# Patient Record
Sex: Female | Born: 1960 | Race: White | Hispanic: No | Marital: Married
Health system: Southern US, Community
[De-identification: ages and names within clinical notes are randomized; demographics above are authoritative.]

## PROBLEM LIST (undated history)

## (undated) DIAGNOSIS — L4050 Arthropathic psoriasis, unspecified: Secondary | ICD-10-CM

## (undated) DIAGNOSIS — M7581 Other shoulder lesions, right shoulder: Secondary | ICD-10-CM

## (undated) DIAGNOSIS — E039 Hypothyroidism, unspecified: Secondary | ICD-10-CM

## (undated) DIAGNOSIS — G8929 Other chronic pain: Secondary | ICD-10-CM

## (undated) DIAGNOSIS — K219 Gastro-esophageal reflux disease without esophagitis: Secondary | ICD-10-CM

## (undated) DIAGNOSIS — J4521 Mild intermittent asthma with (acute) exacerbation: Secondary | ICD-10-CM

## (undated) DIAGNOSIS — I10 Essential (primary) hypertension: Secondary | ICD-10-CM

## (undated) DIAGNOSIS — M48062 Spinal stenosis, lumbar region with neurogenic claudication: Secondary | ICD-10-CM

## (undated) DIAGNOSIS — M75121 Complete rotator cuff tear or rupture of right shoulder, not specified as traumatic: Secondary | ICD-10-CM

## (undated) DIAGNOSIS — M810 Age-related osteoporosis without current pathological fracture: Secondary | ICD-10-CM

## (undated) HISTORY — PX: BREAST BIOPSY: SHX20

---

## 2005-06-23 ENCOUNTER — Ambulatory Visit: Payer: Self-pay | Admitting: Obstetrics and Gynecology

## 2006-12-24 ENCOUNTER — Ambulatory Visit: Payer: Self-pay | Admitting: Obstetrics and Gynecology

## 2008-09-22 ENCOUNTER — Ambulatory Visit (HOSPITAL_COMMUNITY): Admission: RE | Admit: 2008-09-22 | Discharge: 2008-09-22 | Payer: Self-pay | Admitting: Neurosurgery

## 2008-12-12 ENCOUNTER — Ambulatory Visit: Payer: Self-pay | Admitting: Pain Medicine

## 2009-01-16 ENCOUNTER — Ambulatory Visit: Payer: Self-pay | Admitting: Pain Medicine

## 2009-01-24 ENCOUNTER — Ambulatory Visit: Payer: Self-pay | Admitting: Pain Medicine

## 2009-03-01 ENCOUNTER — Ambulatory Visit: Payer: Self-pay | Admitting: Pain Medicine

## 2009-03-07 ENCOUNTER — Ambulatory Visit: Payer: Self-pay | Admitting: Pain Medicine

## 2009-04-03 ENCOUNTER — Ambulatory Visit: Payer: Self-pay | Admitting: Pain Medicine

## 2009-05-28 ENCOUNTER — Ambulatory Visit: Payer: Self-pay | Admitting: Pain Medicine

## 2009-07-10 ENCOUNTER — Ambulatory Visit: Payer: Self-pay | Admitting: Pain Medicine

## 2009-11-13 ENCOUNTER — Ambulatory Visit: Payer: Self-pay | Admitting: Pain Medicine

## 2009-12-13 ENCOUNTER — Ambulatory Visit: Payer: Self-pay | Admitting: Pain Medicine

## 2010-01-08 ENCOUNTER — Ambulatory Visit: Payer: Self-pay | Admitting: Pain Medicine

## 2010-02-07 ENCOUNTER — Ambulatory Visit: Payer: Self-pay | Admitting: Pain Medicine

## 2010-04-04 ENCOUNTER — Ambulatory Visit: Payer: Self-pay | Admitting: Pain Medicine

## 2010-04-17 ENCOUNTER — Ambulatory Visit: Payer: Self-pay | Admitting: Pain Medicine

## 2010-05-09 ENCOUNTER — Ambulatory Visit: Payer: Self-pay | Admitting: Pain Medicine

## 2010-06-04 ENCOUNTER — Encounter: Payer: Self-pay | Admitting: Family Medicine

## 2010-06-13 ENCOUNTER — Ambulatory Visit: Payer: Self-pay | Admitting: Obstetrics and Gynecology

## 2010-06-20 ENCOUNTER — Ambulatory Visit: Payer: Self-pay | Admitting: Pain Medicine

## 2011-07-16 ENCOUNTER — Ambulatory Visit: Payer: Self-pay | Admitting: Family Medicine

## 2013-11-22 ENCOUNTER — Ambulatory Visit: Payer: Self-pay | Admitting: Physician Assistant

## 2013-12-13 ENCOUNTER — Ambulatory Visit: Payer: Self-pay | Admitting: Orthopedic Surgery

## 2014-10-28 NOTE — Op Note (Signed)
PATIENT NAME:  [name], Joanna Harrison, Joanna Harrison  DATE OF PROCEDURE:  12/13/2013  PREOPERATIVE DIAGNOSIS: Right knee degenerative osteoarthritis, medial meniscus tear.   POSTOPERATIVE DIAGNOSIS: Right knee degenerative osteoarthritis, medial meniscus tear with lateral meniscus tear.   PROCEDURE: Arthroscopy, partial medial lateral meniscectomy.   ANESTHESIA: General.   SURGEON: Kennedy BuckerMichael Rachal Dvorsky, MD   DESCRIPTION OF PROCEDURE: The patient was brought to the operating room and after adequate anesthesia was obtained, the right leg was prepped and draped in the usual sterile fashion with a tourniquet applied but not required along with arthroscopic legholder. After patient identification, timeout procedures were completed and having prepped and draped the leg in the usual sterile fashion, an inferolateral portal was made and the arthroscope was introduced. Inspection revealed mild to moderate patellofemoral degenerative change with good patellofemoral tracking. Moving medially, an inferomedial portal was made. There was a tear of the posterior horn as well as in the middle 3rd complex radial tear. There were again mild degenerative changes to both tibia and femoral condyles with evidence of chondrocalcinosis. The ACL was intact in the notch. Going laterally, there was a tear at the junction of the posterior and middle thirds. There was a radial type tear. A meniscal punch was used to debride the tears. Following this, a shaver was utilized to remove the fragments as well as to smooth the edges and finally ArthroCare wand was used to ablate the edges of the meniscus to get a stable margin, both medially and laterally. The gutters were checked and there are no loose bodies. The shaver was inserted and thorough irrigation of the knee was performed. Arthroscope and all instrumentation were removed. The wounds were closed with simple interrupted 4-0 nylon, 20 mL of 0.5% Sensorcaine with  epinephrine was infiltrated into the area of the portals for postoperative analgesia. Xeroform, 4 x 4's, Webril, and Ace wrap applied. The patient was sent to the recovery room in stable condition.   ESTIMATED BLOOD LOSS: Minimal.   COMPLICATIONS: None.   SPECIMEN: None.   IMPLANTS:  None.  Preoperative and postoperative pictures were obtained through the knee scope.   ____________________________ Leitha SchullerMichael J. Moe Brier, MD mjm:dd D: 12/13/2013 20:10:02 ET T: 12/13/2013 21:45:40 ET JOB#: [id number]  cc: Leitha SchullerMichael J. Oreoluwa Aigner, MD, <Dictator> Leitha SchullerMICHAEL J Khrystyna Schwalm MD ELECTRONICALLY SIGNED 12/14/2013 8:10

## 2016-09-03 ENCOUNTER — Other Ambulatory Visit: Payer: Self-pay

## 2016-09-29 ENCOUNTER — Telehealth: Payer: Self-pay | Admitting: Obstetrics & Gynecology

## 2016-09-29 NOTE — Telephone Encounter (Signed)
[facility] requesting refill per Patient, Pt is an former [facility] and needs to be schedule for annual.

## 2016-10-01 NOTE — Telephone Encounter (Signed)
[facility] an annual exam[facility] Prescription Not authorized 09/25/16

## 2016-10-01 NOTE — Telephone Encounter (Signed)
[facility] F[facility] on March 22,2018.

## 2016-10-09 ENCOUNTER — Other Ambulatory Visit: Payer: Self-pay | Admitting: Obstetrics & Gynecology

## 2016-11-19 ENCOUNTER — Other Ambulatory Visit: Payer: Self-pay | Admitting: Family Medicine

## 2016-11-19 ENCOUNTER — Other Ambulatory Visit: Payer: Self-pay | Admitting: Obstetrics & Gynecology

## 2016-11-19 DIAGNOSIS — Z1231 Encounter for screening mammogram for malignant neoplasm of breast: Secondary | ICD-10-CM

## 2016-12-05 ENCOUNTER — Encounter: Payer: Self-pay | Admitting: Radiology

## 2016-12-05 ENCOUNTER — Ambulatory Visit
Admission: RE | Admit: 2016-12-05 | Discharge: 2016-12-05 | Disposition: A | Discharge status: Home / Self Care | Payer: 59 | Source: Ambulatory Visit | Attending: Obstetrics & Gynecology | Admitting: Obstetrics & Gynecology

## 2016-12-05 DIAGNOSIS — Z1231 Encounter for screening mammogram for malignant neoplasm of breast: Secondary | ICD-10-CM | POA: Insufficient documentation

## 2016-12-11 ENCOUNTER — Other Ambulatory Visit: Payer: Self-pay | Admitting: *Deleted

## 2016-12-11 ENCOUNTER — Inpatient Hospital Stay
Admission: RE | Admit: 2016-12-11 | Discharge: 2016-12-11 | Disposition: A | Discharge status: Home / Self Care | Payer: Self-pay | Source: Ambulatory Visit | Attending: *Deleted | Admitting: *Deleted

## 2016-12-11 DIAGNOSIS — Z9289 Personal history of other medical treatment: Secondary | ICD-10-CM

## 2018-03-01 ENCOUNTER — Other Ambulatory Visit: Payer: Self-pay | Admitting: Physician Assistant

## 2018-03-01 DIAGNOSIS — M5030 Other cervical disc degeneration, unspecified cervical region: Secondary | ICD-10-CM

## 2018-03-01 DIAGNOSIS — M25511 Pain in right shoulder: Secondary | ICD-10-CM

## 2018-03-01 DIAGNOSIS — M25512 Pain in left shoulder: Secondary | ICD-10-CM

## 2018-03-14 ENCOUNTER — Ambulatory Visit
Admission: RE | Admit: 2018-03-14 | Discharge: 2018-03-14 | Disposition: A | Discharge status: Home / Self Care | Payer: 59 | Source: Ambulatory Visit | Attending: Physician Assistant | Admitting: Physician Assistant

## 2018-03-14 DIAGNOSIS — G950 Syringomyelia and syringobulbia: Secondary | ICD-10-CM | POA: Insufficient documentation

## 2018-03-14 DIAGNOSIS — M5030 Other cervical disc degeneration, unspecified cervical region: Secondary | ICD-10-CM

## 2018-03-14 DIAGNOSIS — M25512 Pain in left shoulder: Secondary | ICD-10-CM | POA: Insufficient documentation

## 2018-03-14 DIAGNOSIS — M50321 Other cervical disc degeneration at C4-C5 level: Secondary | ICD-10-CM | POA: Insufficient documentation

## 2018-03-14 DIAGNOSIS — M47812 Spondylosis without myelopathy or radiculopathy, cervical region: Secondary | ICD-10-CM | POA: Insufficient documentation

## 2018-03-14 DIAGNOSIS — M25511 Pain in right shoulder: Secondary | ICD-10-CM | POA: Insufficient documentation

## 2018-03-14 DIAGNOSIS — M4802 Spinal stenosis, cervical region: Secondary | ICD-10-CM | POA: Insufficient documentation

## 2018-03-23 ENCOUNTER — Other Ambulatory Visit: Payer: Self-pay | Admitting: Neurological Surgery

## 2018-03-23 DIAGNOSIS — G950 Syringomyelia and syringobulbia: Secondary | ICD-10-CM

## 2018-04-09 ENCOUNTER — Other Ambulatory Visit: Payer: 59

## 2018-04-09 ENCOUNTER — Ambulatory Visit: Payer: 59

## 2018-06-16 ENCOUNTER — Other Ambulatory Visit: Payer: Self-pay | Admitting: Obstetrics & Gynecology

## 2018-06-16 DIAGNOSIS — Z1231 Encounter for screening mammogram for malignant neoplasm of breast: Secondary | ICD-10-CM

## 2018-06-17 ENCOUNTER — Ambulatory Visit
Admission: RE | Admit: 2018-06-17 | Discharge: 2018-06-17 | Disposition: A | Discharge status: Home / Self Care | Payer: 59 | Source: Ambulatory Visit | Attending: Obstetrics & Gynecology | Admitting: Obstetrics & Gynecology

## 2018-06-17 DIAGNOSIS — Z1231 Encounter for screening mammogram for malignant neoplasm of breast: Secondary | ICD-10-CM | POA: Insufficient documentation

## 2019-05-11 ENCOUNTER — Other Ambulatory Visit: Payer: Self-pay | Admitting: Family Medicine

## 2019-05-11 DIAGNOSIS — Z1231 Encounter for screening mammogram for malignant neoplasm of breast: Secondary | ICD-10-CM

## 2019-06-21 ENCOUNTER — Ambulatory Visit
Admission: RE | Admit: 2019-06-21 | Discharge: 2019-06-21 | Disposition: A | Discharge status: Home / Self Care | Payer: 59 | Source: Ambulatory Visit | Attending: Family Medicine | Admitting: Family Medicine

## 2019-06-21 DIAGNOSIS — Z1231 Encounter for screening mammogram for malignant neoplasm of breast: Secondary | ICD-10-CM | POA: Diagnosis not present

## 2019-11-29 IMAGING — MG DIGITAL SCREENING BILATERAL MAMMOGRAM WITH TOMO AND CAD
6 of 10 series · 6 of 30 positions shown · non-contrast
Comparison: Previous exam(s).

CLINICAL DATA: Screening.

EXAM:
DIGITAL SCREENING BILATERAL MAMMOGRAM WITH TOMO AND CAD

[R MLO synth-2D]
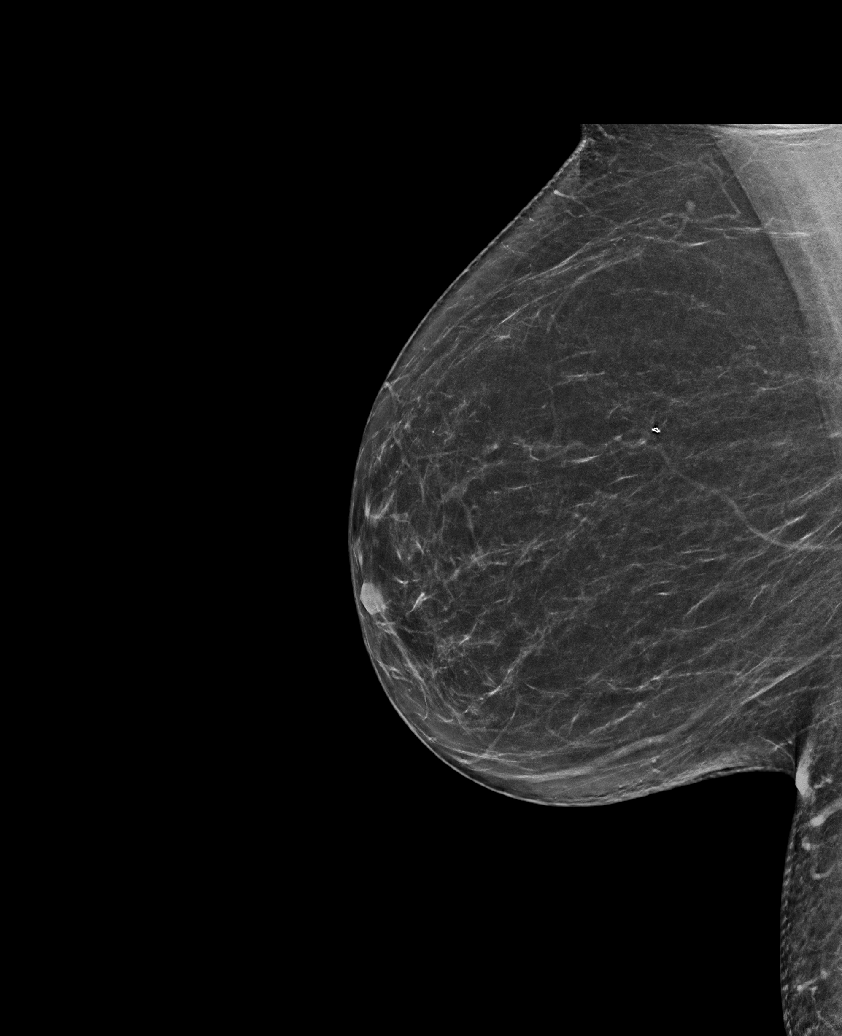

[L CC synth-2D]
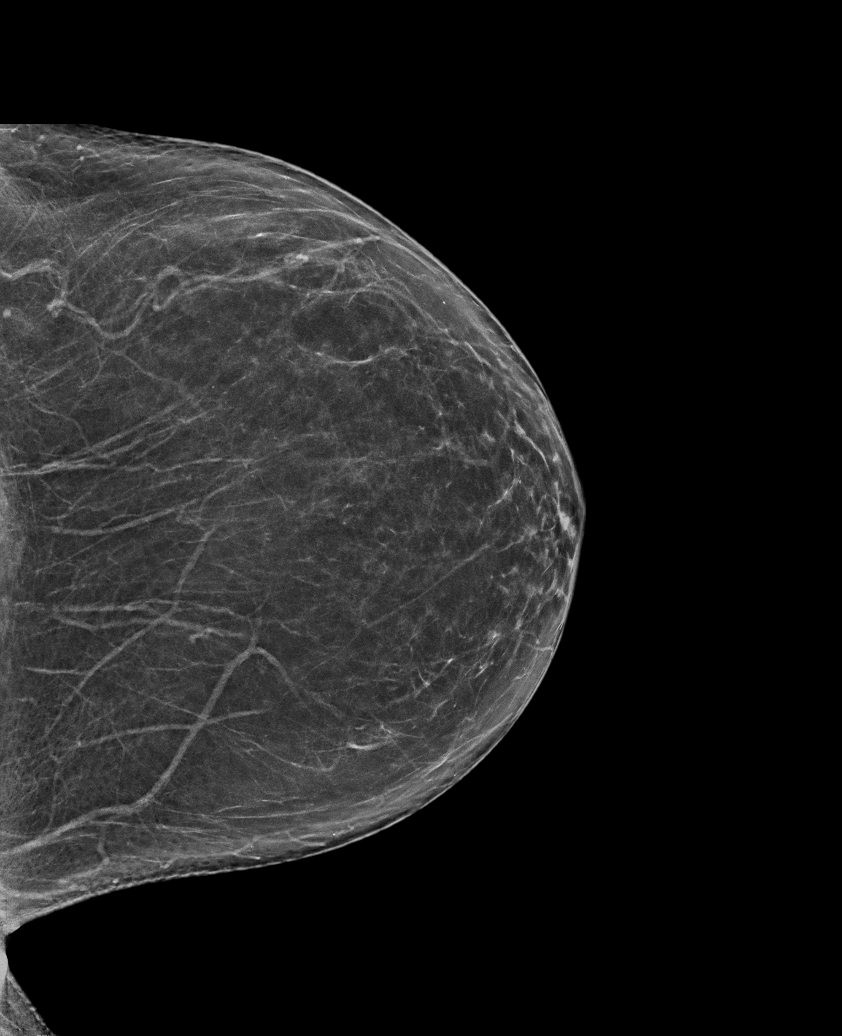

[R XCCL synth-2D]
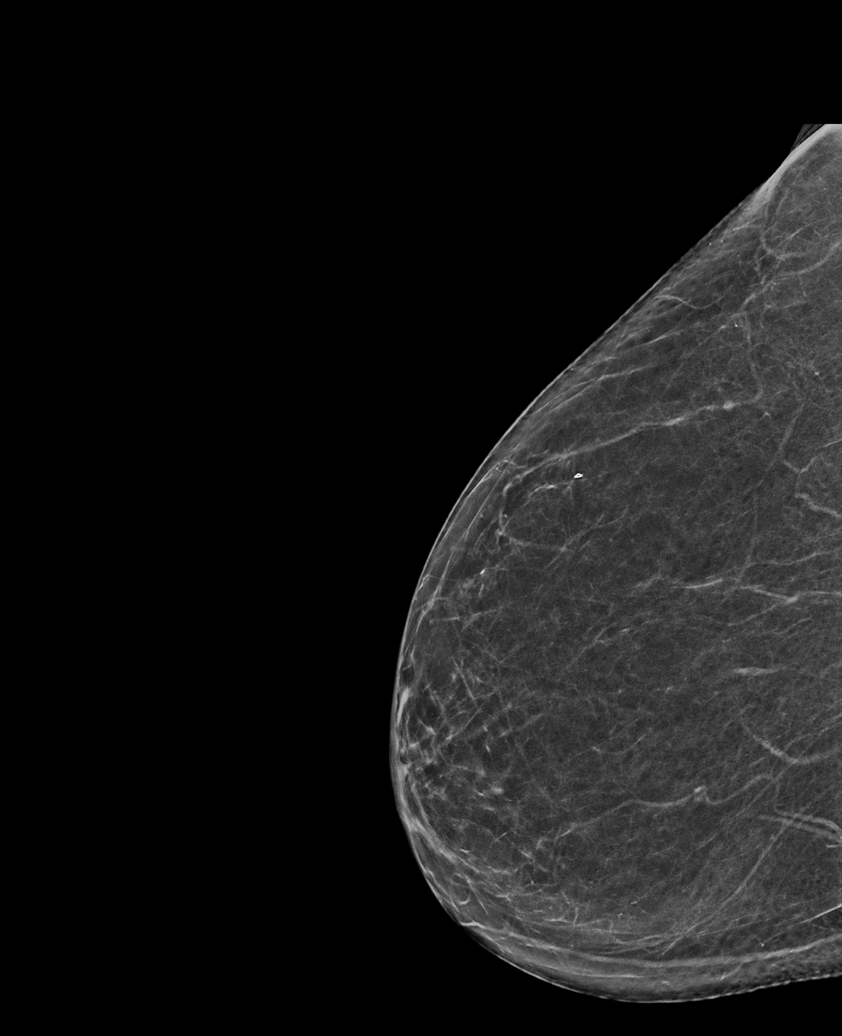

[L MLO synth-2D]
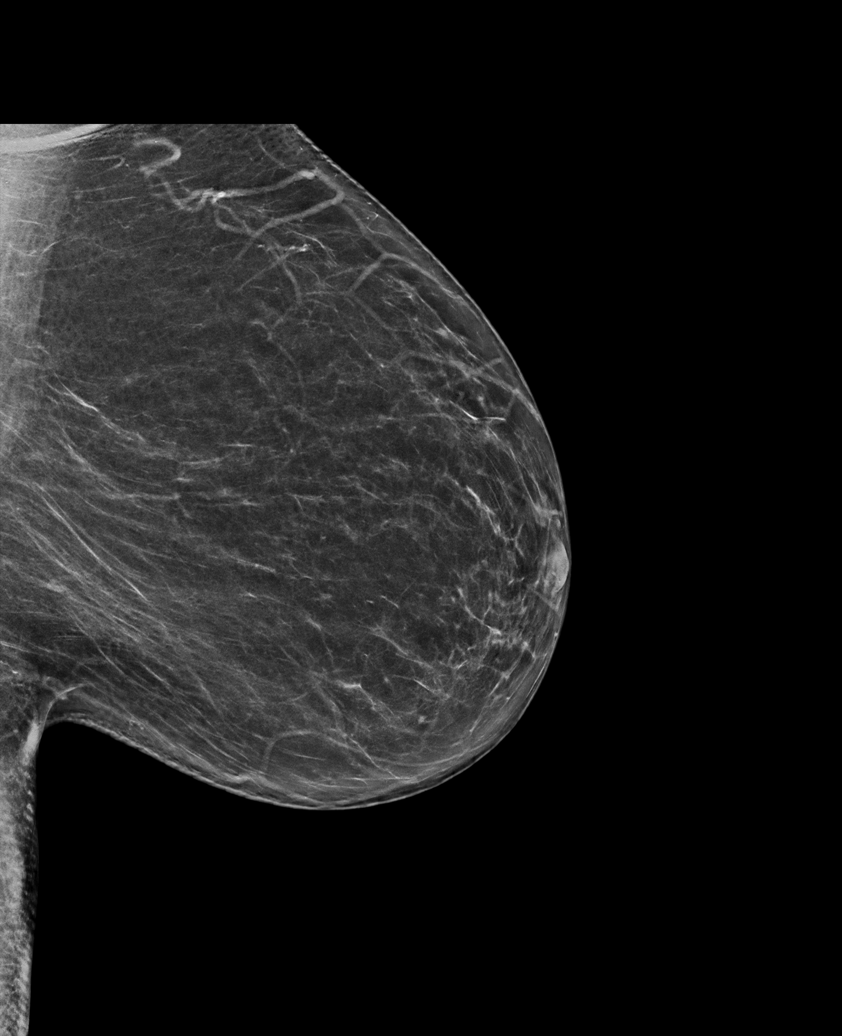

[R CC synth-2D]
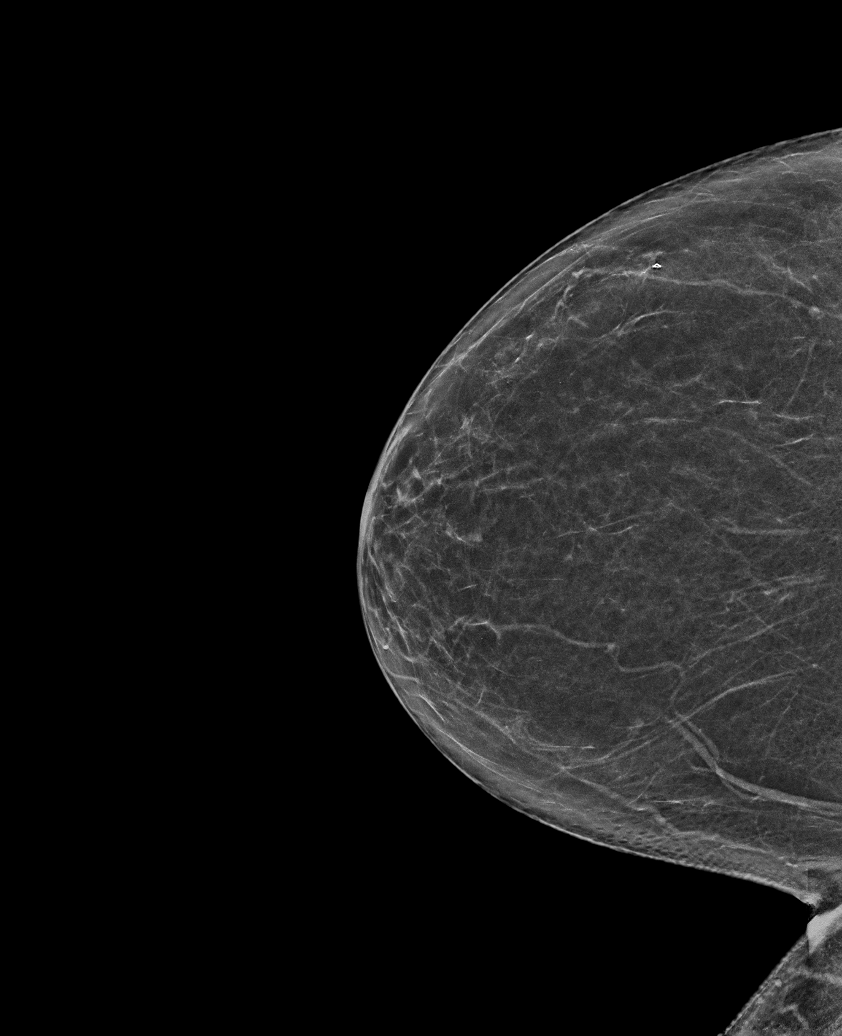

[L CC tomo · tomo slice 35/70.0]
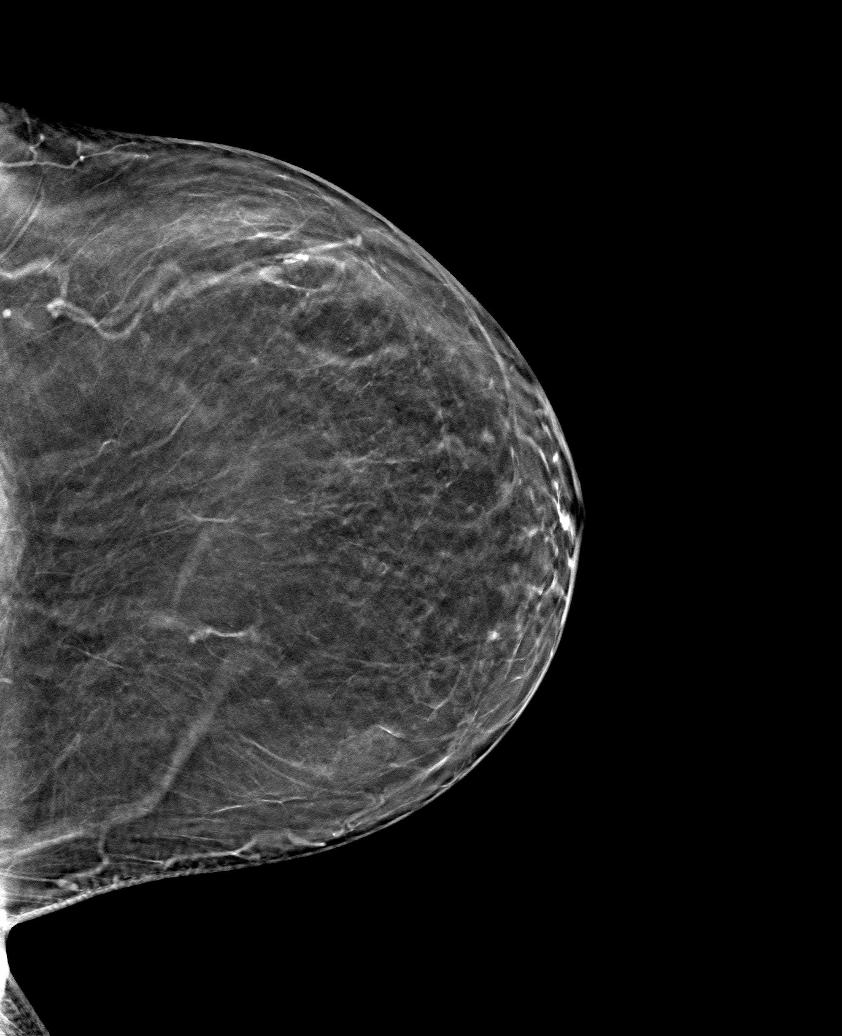

[6 of 30 positions shown; findings below may reference images not displayed]

ACR Breast Density Category b: There are scattered areas of
fibroglandular density.
FINDINGS: There are no findings suspicious for malignancy. Images were
processed with CAD.
IMPRESSION: No mammographic evidence of malignancy. A result letter of this
screening mammogram will be mailed directly to the patient.

RECOMMENDATION:
Screening mammogram in one year. (Code:CN-U-775)

BI-RADS CATEGORY  1: Negative.

## 2021-06-21 ENCOUNTER — Other Ambulatory Visit: Payer: Self-pay | Admitting: Obstetrics and Gynecology

## 2021-06-21 DIAGNOSIS — Z1231 Encounter for screening mammogram for malignant neoplasm of breast: Secondary | ICD-10-CM

## 2021-07-12 DIAGNOSIS — Z79899 Other long term (current) drug therapy: Secondary | ICD-10-CM | POA: Diagnosis not present

## 2021-07-12 DIAGNOSIS — L4050 Arthropathic psoriasis, unspecified: Secondary | ICD-10-CM | POA: Diagnosis not present

## 2021-10-03 DIAGNOSIS — K219 Gastro-esophageal reflux disease without esophagitis: Secondary | ICD-10-CM | POA: Diagnosis not present

## 2021-10-03 DIAGNOSIS — E782 Mixed hyperlipidemia: Secondary | ICD-10-CM | POA: Diagnosis not present

## 2021-10-10 DIAGNOSIS — L4050 Arthropathic psoriasis, unspecified: Secondary | ICD-10-CM | POA: Diagnosis not present

## 2021-10-10 DIAGNOSIS — Z79899 Other long term (current) drug therapy: Secondary | ICD-10-CM | POA: Diagnosis not present

## 2021-10-17 DIAGNOSIS — Z79899 Other long term (current) drug therapy: Secondary | ICD-10-CM | POA: Diagnosis not present

## 2021-10-17 DIAGNOSIS — L4050 Arthropathic psoriasis, unspecified: Secondary | ICD-10-CM | POA: Diagnosis not present

## 2021-11-27 DIAGNOSIS — J4521 Mild intermittent asthma with (acute) exacerbation: Secondary | ICD-10-CM | POA: Diagnosis not present

## 2021-11-27 DIAGNOSIS — J302 Other seasonal allergic rhinitis: Secondary | ICD-10-CM | POA: Diagnosis not present

## 2021-11-27 DIAGNOSIS — R69 Illness, unspecified: Secondary | ICD-10-CM | POA: Diagnosis not present

## 2021-11-27 DIAGNOSIS — L409 Psoriasis, unspecified: Secondary | ICD-10-CM | POA: Diagnosis not present

## 2021-11-27 DIAGNOSIS — E079 Disorder of thyroid, unspecified: Secondary | ICD-10-CM | POA: Diagnosis not present

## 2022-02-17 DIAGNOSIS — Z79899 Other long term (current) drug therapy: Secondary | ICD-10-CM | POA: Diagnosis not present

## 2022-02-17 DIAGNOSIS — L4050 Arthropathic psoriasis, unspecified: Secondary | ICD-10-CM | POA: Diagnosis not present

## 2022-03-31 DIAGNOSIS — L4050 Arthropathic psoriasis, unspecified: Secondary | ICD-10-CM | POA: Diagnosis not present

## 2022-05-07 DIAGNOSIS — L4050 Arthropathic psoriasis, unspecified: Secondary | ICD-10-CM | POA: Diagnosis not present

## 2022-05-07 DIAGNOSIS — Z79899 Other long term (current) drug therapy: Secondary | ICD-10-CM | POA: Diagnosis not present

## 2022-05-09 DIAGNOSIS — J019 Acute sinusitis, unspecified: Secondary | ICD-10-CM | POA: Diagnosis not present

## 2022-05-09 DIAGNOSIS — B9689 Other specified bacterial agents as the cause of diseases classified elsewhere: Secondary | ICD-10-CM | POA: Diagnosis not present

## 2022-06-02 DIAGNOSIS — R053 Chronic cough: Secondary | ICD-10-CM | POA: Diagnosis not present

## 2022-06-02 DIAGNOSIS — L4050 Arthropathic psoriasis, unspecified: Secondary | ICD-10-CM | POA: Diagnosis not present

## 2022-06-02 DIAGNOSIS — J4520 Mild intermittent asthma, uncomplicated: Secondary | ICD-10-CM | POA: Diagnosis not present

## 2022-06-10 ENCOUNTER — Other Ambulatory Visit: Payer: Self-pay | Admitting: Obstetrics and Gynecology

## 2022-06-10 DIAGNOSIS — Z1231 Encounter for screening mammogram for malignant neoplasm of breast: Secondary | ICD-10-CM

## 2022-06-12 DIAGNOSIS — N390 Urinary tract infection, site not specified: Secondary | ICD-10-CM | POA: Diagnosis not present

## 2022-06-25 DIAGNOSIS — E079 Disorder of thyroid, unspecified: Secondary | ICD-10-CM | POA: Diagnosis not present

## 2022-06-25 DIAGNOSIS — K219 Gastro-esophageal reflux disease without esophagitis: Secondary | ICD-10-CM | POA: Diagnosis not present

## 2022-06-25 DIAGNOSIS — Z0000 Encounter for general adult medical examination without abnormal findings: Secondary | ICD-10-CM | POA: Diagnosis not present

## 2022-06-25 DIAGNOSIS — R69 Illness, unspecified: Secondary | ICD-10-CM | POA: Diagnosis not present

## 2022-06-25 DIAGNOSIS — E782 Mixed hyperlipidemia: Secondary | ICD-10-CM | POA: Diagnosis not present

## 2022-06-25 DIAGNOSIS — L4050 Arthropathic psoriasis, unspecified: Secondary | ICD-10-CM | POA: Diagnosis not present

## 2022-06-25 DIAGNOSIS — J4521 Mild intermittent asthma with (acute) exacerbation: Secondary | ICD-10-CM | POA: Diagnosis not present

## 2022-07-16 ENCOUNTER — Ambulatory Visit
Admission: RE | Admit: 2022-07-16 | Discharge: 2022-07-16 | Disposition: A | Discharge status: Home / Self Care | Payer: 59 | Source: Ambulatory Visit | Attending: Obstetrics and Gynecology | Admitting: Obstetrics and Gynecology

## 2022-07-16 DIAGNOSIS — Z1231 Encounter for screening mammogram for malignant neoplasm of breast: Secondary | ICD-10-CM | POA: Diagnosis not present

## 2022-08-08 DIAGNOSIS — M545 Low back pain, unspecified: Secondary | ICD-10-CM | POA: Diagnosis not present

## 2022-08-08 DIAGNOSIS — G8929 Other chronic pain: Secondary | ICD-10-CM | POA: Diagnosis not present

## 2022-08-08 DIAGNOSIS — M79652 Pain in left thigh: Secondary | ICD-10-CM | POA: Diagnosis not present

## 2022-08-13 DIAGNOSIS — E782 Mixed hyperlipidemia: Secondary | ICD-10-CM | POA: Diagnosis not present

## 2022-08-13 DIAGNOSIS — L4050 Arthropathic psoriasis, unspecified: Secondary | ICD-10-CM | POA: Diagnosis not present

## 2022-08-13 DIAGNOSIS — K219 Gastro-esophageal reflux disease without esophagitis: Secondary | ICD-10-CM | POA: Diagnosis not present

## 2022-08-13 DIAGNOSIS — Z79899 Other long term (current) drug therapy: Secondary | ICD-10-CM | POA: Diagnosis not present

## 2022-08-13 DIAGNOSIS — R69 Illness, unspecified: Secondary | ICD-10-CM | POA: Diagnosis not present

## 2022-08-13 DIAGNOSIS — M5416 Radiculopathy, lumbar region: Secondary | ICD-10-CM | POA: Diagnosis not present

## 2022-08-13 DIAGNOSIS — F32A Depression, unspecified: Secondary | ICD-10-CM | POA: Diagnosis not present

## 2022-08-13 DIAGNOSIS — M5136 Other intervertebral disc degeneration, lumbar region: Secondary | ICD-10-CM | POA: Diagnosis not present

## 2022-08-27 ENCOUNTER — Other Ambulatory Visit: Payer: Self-pay | Admitting: Family Medicine

## 2022-08-27 DIAGNOSIS — M5416 Radiculopathy, lumbar region: Secondary | ICD-10-CM

## 2022-09-09 ENCOUNTER — Encounter: Payer: Self-pay | Admitting: Family Medicine

## 2022-09-10 ENCOUNTER — Ambulatory Visit
Admission: RE | Admit: 2022-09-10 | Discharge: 2022-09-10 | Disposition: A | Discharge status: Home / Self Care | Payer: 59 | Source: Ambulatory Visit | Attending: Family Medicine | Admitting: Family Medicine

## 2022-09-10 DIAGNOSIS — M4316 Spondylolisthesis, lumbar region: Secondary | ICD-10-CM | POA: Diagnosis not present

## 2022-09-10 DIAGNOSIS — M48061 Spinal stenosis, lumbar region without neurogenic claudication: Secondary | ICD-10-CM | POA: Diagnosis not present

## 2022-09-10 DIAGNOSIS — M5416 Radiculopathy, lumbar region: Secondary | ICD-10-CM

## 2022-09-10 DIAGNOSIS — M5127 Other intervertebral disc displacement, lumbosacral region: Secondary | ICD-10-CM | POA: Diagnosis not present

## 2022-09-15 DIAGNOSIS — M5416 Radiculopathy, lumbar region: Secondary | ICD-10-CM | POA: Diagnosis not present

## 2022-09-15 DIAGNOSIS — M5136 Other intervertebral disc degeneration, lumbar region: Secondary | ICD-10-CM | POA: Diagnosis not present

## 2022-09-15 DIAGNOSIS — M5126 Other intervertebral disc displacement, lumbar region: Secondary | ICD-10-CM | POA: Diagnosis not present

## 2022-09-17 DIAGNOSIS — M5126 Other intervertebral disc displacement, lumbar region: Secondary | ICD-10-CM | POA: Diagnosis not present

## 2022-09-17 DIAGNOSIS — M5416 Radiculopathy, lumbar region: Secondary | ICD-10-CM | POA: Diagnosis not present

## 2022-10-16 DIAGNOSIS — M5136 Other intervertebral disc degeneration, lumbar region: Secondary | ICD-10-CM | POA: Diagnosis not present

## 2022-10-16 DIAGNOSIS — M5416 Radiculopathy, lumbar region: Secondary | ICD-10-CM | POA: Diagnosis not present

## 2022-10-16 DIAGNOSIS — M47816 Spondylosis without myelopathy or radiculopathy, lumbar region: Secondary | ICD-10-CM | POA: Diagnosis not present

## 2022-10-16 DIAGNOSIS — M5126 Other intervertebral disc displacement, lumbar region: Secondary | ICD-10-CM | POA: Diagnosis not present

## 2022-10-24 DIAGNOSIS — M47816 Spondylosis without myelopathy or radiculopathy, lumbar region: Secondary | ICD-10-CM | POA: Diagnosis not present

## 2022-11-11 DIAGNOSIS — M47816 Spondylosis without myelopathy or radiculopathy, lumbar region: Secondary | ICD-10-CM | POA: Diagnosis not present

## 2022-11-12 DIAGNOSIS — M25561 Pain in right knee: Secondary | ICD-10-CM | POA: Diagnosis not present

## 2022-11-12 DIAGNOSIS — M1711 Unilateral primary osteoarthritis, right knee: Secondary | ICD-10-CM | POA: Diagnosis not present

## 2022-11-24 DIAGNOSIS — Z79899 Other long term (current) drug therapy: Secondary | ICD-10-CM | POA: Diagnosis not present

## 2022-11-24 DIAGNOSIS — L4050 Arthropathic psoriasis, unspecified: Secondary | ICD-10-CM | POA: Diagnosis not present

## 2022-11-25 DIAGNOSIS — M47816 Spondylosis without myelopathy or radiculopathy, lumbar region: Secondary | ICD-10-CM | POA: Diagnosis not present

## 2022-12-19 DIAGNOSIS — L4050 Arthropathic psoriasis, unspecified: Secondary | ICD-10-CM | POA: Diagnosis not present

## 2022-12-19 DIAGNOSIS — G8929 Other chronic pain: Secondary | ICD-10-CM | POA: Diagnosis not present

## 2022-12-19 DIAGNOSIS — M545 Low back pain, unspecified: Secondary | ICD-10-CM | POA: Diagnosis not present

## 2022-12-19 DIAGNOSIS — F32A Depression, unspecified: Secondary | ICD-10-CM | POA: Diagnosis not present

## 2022-12-19 DIAGNOSIS — E079 Disorder of thyroid, unspecified: Secondary | ICD-10-CM | POA: Diagnosis not present

## 2022-12-19 DIAGNOSIS — K219 Gastro-esophageal reflux disease without esophagitis: Secondary | ICD-10-CM | POA: Diagnosis not present

## 2023-01-07 DIAGNOSIS — M47816 Spondylosis without myelopathy or radiculopathy, lumbar region: Secondary | ICD-10-CM | POA: Diagnosis not present

## 2023-01-07 DIAGNOSIS — M5136 Other intervertebral disc degeneration, lumbar region: Secondary | ICD-10-CM | POA: Diagnosis not present

## 2023-01-07 DIAGNOSIS — M5416 Radiculopathy, lumbar region: Secondary | ICD-10-CM | POA: Diagnosis not present

## 2023-01-26 DIAGNOSIS — Z79899 Other long term (current) drug therapy: Secondary | ICD-10-CM | POA: Diagnosis not present

## 2023-01-26 DIAGNOSIS — L4050 Arthropathic psoriasis, unspecified: Secondary | ICD-10-CM | POA: Diagnosis not present

## 2023-04-29 DIAGNOSIS — L4050 Arthropathic psoriasis, unspecified: Secondary | ICD-10-CM | POA: Diagnosis not present

## 2023-06-29 DIAGNOSIS — E079 Disorder of thyroid, unspecified: Secondary | ICD-10-CM | POA: Diagnosis not present

## 2023-06-29 DIAGNOSIS — Z0000 Encounter for general adult medical examination without abnormal findings: Secondary | ICD-10-CM | POA: Diagnosis not present

## 2023-06-29 DIAGNOSIS — K219 Gastro-esophageal reflux disease without esophagitis: Secondary | ICD-10-CM | POA: Diagnosis not present

## 2023-06-29 DIAGNOSIS — E782 Mixed hyperlipidemia: Secondary | ICD-10-CM | POA: Diagnosis not present

## 2023-06-29 DIAGNOSIS — F32A Depression, unspecified: Secondary | ICD-10-CM | POA: Diagnosis not present

## 2023-06-29 DIAGNOSIS — L4050 Arthropathic psoriasis, unspecified: Secondary | ICD-10-CM | POA: Diagnosis not present

## 2023-07-08 DIAGNOSIS — R399 Unspecified symptoms and signs involving the genitourinary system: Secondary | ICD-10-CM | POA: Diagnosis not present

## 2023-07-20 DIAGNOSIS — R300 Dysuria: Secondary | ICD-10-CM | POA: Diagnosis not present

## 2023-07-20 DIAGNOSIS — Z87440 Personal history of urinary (tract) infections: Secondary | ICD-10-CM | POA: Diagnosis not present

## 2023-08-14 DIAGNOSIS — E782 Mixed hyperlipidemia: Secondary | ICD-10-CM | POA: Diagnosis not present

## 2023-08-14 DIAGNOSIS — Z79899 Other long term (current) drug therapy: Secondary | ICD-10-CM | POA: Diagnosis not present

## 2023-08-14 DIAGNOSIS — R52 Pain, unspecified: Secondary | ICD-10-CM | POA: Diagnosis not present

## 2023-08-14 DIAGNOSIS — L4050 Arthropathic psoriasis, unspecified: Secondary | ICD-10-CM | POA: Diagnosis not present

## 2023-08-25 DIAGNOSIS — M5416 Radiculopathy, lumbar region: Secondary | ICD-10-CM | POA: Diagnosis not present

## 2023-08-25 DIAGNOSIS — M4316 Spondylolisthesis, lumbar region: Secondary | ICD-10-CM | POA: Diagnosis not present

## 2023-08-31 ENCOUNTER — Other Ambulatory Visit: Payer: Self-pay | Admitting: Family Medicine

## 2023-08-31 DIAGNOSIS — Z1231 Encounter for screening mammogram for malignant neoplasm of breast: Secondary | ICD-10-CM

## 2023-09-11 DIAGNOSIS — M8588 Other specified disorders of bone density and structure, other site: Secondary | ICD-10-CM | POA: Diagnosis not present

## 2023-09-18 ENCOUNTER — Ambulatory Visit
Admission: RE | Admit: 2023-09-18 | Discharge: 2023-09-18 | Disposition: A | Discharge status: Home / Self Care | Payer: 59 | Source: Ambulatory Visit | Attending: Family Medicine | Admitting: Family Medicine

## 2023-09-18 DIAGNOSIS — Z1231 Encounter for screening mammogram for malignant neoplasm of breast: Secondary | ICD-10-CM | POA: Insufficient documentation

## 2023-09-25 DIAGNOSIS — Z01419 Encounter for gynecological examination (general) (routine) without abnormal findings: Secondary | ICD-10-CM | POA: Diagnosis not present

## 2023-10-09 DIAGNOSIS — E782 Mixed hyperlipidemia: Secondary | ICD-10-CM | POA: Diagnosis not present

## 2023-10-09 DIAGNOSIS — E079 Disorder of thyroid, unspecified: Secondary | ICD-10-CM | POA: Diagnosis not present

## 2023-10-22 DIAGNOSIS — H43813 Vitreous degeneration, bilateral: Secondary | ICD-10-CM | POA: Diagnosis not present

## 2023-10-22 DIAGNOSIS — H2513 Age-related nuclear cataract, bilateral: Secondary | ICD-10-CM | POA: Diagnosis not present

## 2023-10-26 DIAGNOSIS — Z1211 Encounter for screening for malignant neoplasm of colon: Secondary | ICD-10-CM | POA: Diagnosis not present

## 2023-10-31 DIAGNOSIS — M4726 Other spondylosis with radiculopathy, lumbar region: Secondary | ICD-10-CM | POA: Diagnosis not present

## 2023-10-31 DIAGNOSIS — M4807 Spinal stenosis, lumbosacral region: Secondary | ICD-10-CM | POA: Diagnosis not present

## 2023-10-31 DIAGNOSIS — M4316 Spondylolisthesis, lumbar region: Secondary | ICD-10-CM | POA: Diagnosis not present

## 2023-10-31 DIAGNOSIS — M48061 Spinal stenosis, lumbar region without neurogenic claudication: Secondary | ICD-10-CM | POA: Diagnosis not present

## 2023-10-31 DIAGNOSIS — M5116 Intervertebral disc disorders with radiculopathy, lumbar region: Secondary | ICD-10-CM | POA: Diagnosis not present

## 2023-10-31 DIAGNOSIS — M419 Scoliosis, unspecified: Secondary | ICD-10-CM | POA: Diagnosis not present

## 2023-10-31 DIAGNOSIS — M5416 Radiculopathy, lumbar region: Secondary | ICD-10-CM | POA: Diagnosis not present

## 2023-11-03 ENCOUNTER — Encounter: Payer: Self-pay | Admitting: Internal Medicine

## 2023-11-04 ENCOUNTER — Ambulatory Visit: Admitting: Certified Registered"

## 2023-11-04 ENCOUNTER — Ambulatory Visit
Admission: RE | Admit: 2023-11-04 | Discharge: 2023-11-04 | Disposition: A | Discharge status: Home / Self Care | Attending: Internal Medicine | Admitting: Internal Medicine

## 2023-11-04 ENCOUNTER — Encounter
Admission: RE | Disposition: A | Discharge status: Home / Self Care | Payer: Self-pay | Source: Home / Self Care | Attending: Internal Medicine

## 2023-11-04 DIAGNOSIS — K640 First degree hemorrhoids: Secondary | ICD-10-CM | POA: Diagnosis not present

## 2023-11-04 DIAGNOSIS — K5730 Diverticulosis of large intestine without perforation or abscess without bleeding: Secondary | ICD-10-CM | POA: Diagnosis not present

## 2023-11-04 DIAGNOSIS — Z79890 Hormone replacement therapy (postmenopausal): Secondary | ICD-10-CM | POA: Insufficient documentation

## 2023-11-04 DIAGNOSIS — K648 Other hemorrhoids: Secondary | ICD-10-CM | POA: Diagnosis not present

## 2023-11-04 DIAGNOSIS — Z1211 Encounter for screening for malignant neoplasm of colon: Secondary | ICD-10-CM | POA: Diagnosis not present

## 2023-11-04 DIAGNOSIS — E039 Hypothyroidism, unspecified: Secondary | ICD-10-CM | POA: Diagnosis not present

## 2023-11-04 SURGERY — COLONOSCOPY
Anesthesia: General

## 2023-11-04 MED ORDER — SODIUM CHLORIDE 0.9 % IV SOLN
INTRAVENOUS | Status: DC
  Administered 2023-11-04: 20 mL/h via INTRAVENOUS

## 2023-11-04 MED ORDER — PROPOFOL 500 MG/50ML IV EMUL
INTRAVENOUS | Status: DC | PRN
Start: 2023-11-04 — End: 2023-11-04
  Administered 2023-11-04: 150 ug/kg/min via INTRAVENOUS
  Administered 2023-11-04: 50 mg via INTRAVENOUS

## 2023-11-04 MED ORDER — LIDOCAINE HCL (CARDIAC) PF 100 MG/5ML IV SOSY
PREFILLED_SYRINGE | INTRAVENOUS | Status: DC | PRN
  Administered 2023-11-04: 100 mg via INTRAVENOUS

## 2023-11-04 NOTE — Op Note (Signed)
 [facility] Gastroenterology Patient Name: Joanna Harrison Procedure Date: 11/04/2023 10:29 AM MRN: [id number] Account #: [id number] Date of Birth: [birth year] Admit Type: Outpatient Age: 63 Room: [facility] ENDO ROOM 1 Gender: Female Note Status: Finalized Instrument Name: Charlyn Cooley [id number] Procedure:             Colonoscopy Indications:           Screening for colorectal malignant neoplasm Providers:             Alyannah Sanks K. Sammuel Blick MD, MD Medicines:             Propofol per Anesthesia Complications:         No immediate complications. Procedure:             Pre-Anesthesia Assessment:                        - The risks and benefits of the procedure and the                         sedation options and risks were discussed with the                         patient. All questions were answered and informed                         consent was obtained.                        - Patient identification and proposed procedure were                         verified prior to the procedure by the nurse. The                         procedure was verified in the procedure room.                        - ASA Grade Assessment: III - A patient with severe                         systemic disease.                        - After reviewing the risks and benefits, the patient                         was deemed in satisfactory condition to undergo the                         procedure.                        After obtaining informed consent, the colonoscope was                         passed under direct vision. Throughout the procedure,                         the patient's blood pressure, pulse, and oxygen  saturations were monitored continuously. The                         Colonoscope was introduced through the anus and                         advanced to the the cecum, identified by appendiceal                         orifice and ileocecal valve. The colonoscopy was                          performed without difficulty. The patient tolerated                         the procedure well. The quality of the bowel                         preparation was good. The ileocecal valve, appendiceal                         orifice, and rectum were photographed. Findings:      The perianal and digital rectal examinations were normal. Pertinent       negatives include normal sphincter tone and no palpable rectal lesions.      Non-bleeding internal hemorrhoids were found during retroflexion. The       hemorrhoids were Grade I (internal hemorrhoids that do not prolapse).      A few medium-mouthed diverticula were found in the ascending colon.      The exam was otherwise without abnormality. Impression:            - Non-bleeding internal hemorrhoids.                        - Diverticulosis in the ascending colon.                        - The examination was otherwise normal.                        - No specimens collected. Recommendation:        - Patient has a contact number available for                         emergencies. The signs and symptoms of potential                         delayed complications were discussed with the patient.                         Return to normal activities tomorrow. Written                         discharge instructions were provided to the patient.                        - Resume previous diet.                        - Continue  present medications.                        - Repeat colonoscopy in 10 years for screening                         purposes.                        - Return to GI office PRN.                        - The findings and recommendations were discussed with                         the patient. Procedure Code(s):     --- Professional ---                        Z6109, Colorectal cancer screening; colonoscopy on                         individual not meeting criteria for high risk Diagnosis Code(s):     --- Professional  ---                        K57.30, Diverticulosis of large intestine without                         perforation or abscess without bleeding                        K64.0, First degree hemorrhoids                        Z12.11, Encounter for screening for malignant neoplasm                         of colon CPT copyright 2022 [employer]. All rights reserved. The codes documented in this report are preliminary and upon coder review may  be revised to meet current compliance requirements. Cassie Click MD, MD 11/04/2023 11:00:46 AM This report has been signed electronically. Number of Addenda: 0 Note Initiated On: 11/04/2023 10:29 AM Scope Withdrawal Time: 0 hours 6 minutes 1 second  Total Procedure Duration: 0 hours 11 minutes 56 seconds  Estimated Blood Loss:  Estimated blood loss: none.      [facility]

## 2023-11-04 NOTE — Interval H&P Note (Signed)
 History and Physical Interval Note:  11/04/2023 10:11 AM  Joanna Harrison  has presented today for surgery, with the diagnosis of Screening for colon cancer (Z12.11).  The various methods of treatment have been discussed with the patient and family. After consideration of risks, benefits and other options for treatment, the patient has consented to  Procedure(s): COLONOSCOPY (N/A) as a surgical intervention.  The patient's history has been reviewed, patient examined, no change in status, stable for surgery.  I have reviewed the patient's chart and labs.  Questions were answered to the patient's satisfaction.     [place], Leather Estis

## 2023-11-04 NOTE — Anesthesia Postprocedure Evaluation (Signed)
 Anesthesia Post Note  Patient: Joanna Harrison  Procedure(s) Performed: COLONOSCOPY  Patient location during evaluation: PACU Anesthesia Type: General Level of consciousness: awake and alert Pain management: pain level controlled Vital Signs Assessment: post-procedure vital signs reviewed and stable Respiratory status: spontaneous breathing, nonlabored ventilation, respiratory function stable and patient connected to nasal cannula oxygen Cardiovascular status: blood pressure returned to baseline and stable Postop Assessment: no apparent nausea or vomiting Anesthetic complications: no  There were no known notable events for this encounter.   Last Vitals:  Vitals:   11/04/23 1007 11/04/23 1059  BP: 118/77 138/88  Pulse: (!) 104 80  Resp: 20 20  Temp: (!) 36.1 C (!) 36.2 C  SpO2: 97% 94%    Last Pain:  Vitals:   11/04/23 1059  TempSrc: Temporal  PainSc:                  Enrique Harvest

## 2023-11-04 NOTE — Transfer of Care (Signed)
 Immediate Anesthesia Transfer of Care Note  Patient: Joanna Harrison  Procedure(s) Performed: COLONOSCOPY  Patient Location: PACU  Anesthesia Type:General  Level of Consciousness: awake, alert , and oriented  Airway & Oxygen Therapy: Patient Spontanous Breathing  Post-op Assessment: Report given to RN and Post -op Vital signs reviewed and stable  Post vital signs: stable  Last Vitals:  Vitals Value Taken Time  BP    Temp    Pulse 79 11/04/23 1100  Resp 17 11/04/23 1100  SpO2 95 % 11/04/23 1100  Vitals shown include unfiled device data.  Last Pain:  Vitals:   11/04/23 1007  TempSrc: Temporal  PainSc: 7          Complications: No notable events documented.

## 2023-11-04 NOTE — Anesthesia Preprocedure Evaluation (Signed)
 Anesthesia Evaluation  Patient identified by MRN, date of birth, ID band Patient awake    Reviewed: Allergy & Precautions, NPO status , Patient's Chart, lab work & pertinent test results  Airway Mallampati: III  TM Distance: >3 FB Neck ROM: full    Dental  (+) Chipped   Pulmonary neg pulmonary ROS   Pulmonary exam normal        Cardiovascular negative cardio ROS Normal cardiovascular exam     Neuro/Psych negative neurological ROS  negative psych ROS   GI/Hepatic negative GI ROS, Neg liver ROS,,,  Endo/Other  Hypothyroidism    Renal/GU negative Renal ROS  negative genitourinary   Musculoskeletal   Abdominal   Peds  Hematology negative hematology ROS (+)   Anesthesia Other Findings History reviewed. No pertinent past medical history.  Past Surgical History: 90s: BREAST BIOPSY; Right     Comment:  clip placement, benign  BMI    Body Mass Index: 34.40 kg/m      Reproductive/Obstetrics negative OB ROS                             Anesthesia Physical Anesthesia Plan  ASA: 2  Anesthesia Plan: General   Post-op Pain Management: Minimal or no pain anticipated   Induction: Intravenous  PONV Risk Score and Plan: 3 and Propofol infusion, TIVA and Ondansetron  Airway Management Planned: Nasal Cannula  Additional Equipment: None  Intra-op Plan:   Post-operative Plan:   Informed Consent: I have reviewed the patients History and Physical, chart, labs and discussed the procedure including the risks, benefits and alternatives for the proposed anesthesia with the patient or authorized representative who has indicated his/her understanding and acceptance.     Dental advisory given  Plan Discussed with: CRNA and Surgeon  Anesthesia Plan Comments: (Discussed risks of anesthesia with patient, including possibility of difficulty with spontaneous ventilation under anesthesia  necessitating airway intervention, PONV, and rare risks such as cardiac or respiratory or neurological events, and allergic reactions. Discussed the role of CRNA in patient's perioperative care. Patient understands.)       Anesthesia Quick Evaluation

## 2023-11-04 NOTE — H&P (Signed)
 Outpatient short stay form Pre-procedure 11/04/2023 10:10 AM Joanna Harrison K. Corky Diener, M.D.  Primary Physician: Rory Collard, M.D.  Reason for visit:  Colon cancer screening  History of present illness:  Patient presents for colonoscopy for colon cancer screening. The patient denies complaints of abdominal pain, significant change in bowel habits, or rectal bleeding.      Current Facility-Administered Medications:    0.9 %  sodium chloride infusion, , Intravenous, Continuous, Alleta Avery K, MD  Medications Prior to Admission  Medication Sig Dispense Refill Last Dose/Taking   levothyroxine (SYNTHROID) 200 MCG tablet Take 200 mcg by mouth daily before breakfast.   11/04/2023 at  6:00 AM     Not on File   History reviewed. No pertinent past medical history.  Review of systems:  Otherwise negative.    Physical Exam  Gen: Alert, oriented. Appears stated age.  HEENT: [place]/AT. PERRLA. Lungs: CTA, no wheezes. CV: RR nl S1, S2. Abd: soft, benign, no masses. BS+ Ext: No edema. Pulses 2+    Planned procedures: Proceed with colonoscopy. The patient understands the nature of the planned procedure, indications, risks, alternatives and potential complications including but not limited to bleeding, infection, perforation, damage to internal organs and possible oversedation/side effects from anesthesia. The patient agrees and gives consent to proceed.  Please refer to procedure notes for findings, recommendations and patient disposition/instructions.     Khalin Royce K. Corky Diener, M.D. Gastroenterology 11/04/2023  10:10 AM

## 2023-11-05 ENCOUNTER — Encounter: Payer: Self-pay | Admitting: Internal Medicine

## 2023-11-12 DIAGNOSIS — M5441 Lumbago with sciatica, right side: Secondary | ICD-10-CM | POA: Diagnosis not present

## 2023-11-12 DIAGNOSIS — G8929 Other chronic pain: Secondary | ICD-10-CM | POA: Diagnosis not present

## 2023-11-18 DIAGNOSIS — M5441 Lumbago with sciatica, right side: Secondary | ICD-10-CM | POA: Diagnosis not present

## 2023-11-18 DIAGNOSIS — G8929 Other chronic pain: Secondary | ICD-10-CM | POA: Diagnosis not present

## 2023-11-25 DIAGNOSIS — M5441 Lumbago with sciatica, right side: Secondary | ICD-10-CM | POA: Diagnosis not present

## 2023-11-25 DIAGNOSIS — G8929 Other chronic pain: Secondary | ICD-10-CM | POA: Diagnosis not present

## 2023-12-01 DIAGNOSIS — G8929 Other chronic pain: Secondary | ICD-10-CM | POA: Diagnosis not present

## 2023-12-01 DIAGNOSIS — M5441 Lumbago with sciatica, right side: Secondary | ICD-10-CM | POA: Diagnosis not present

## 2023-12-10 DIAGNOSIS — G8929 Other chronic pain: Secondary | ICD-10-CM | POA: Diagnosis not present

## 2023-12-10 DIAGNOSIS — M5441 Lumbago with sciatica, right side: Secondary | ICD-10-CM | POA: Diagnosis not present

## 2023-12-11 DIAGNOSIS — Z79899 Other long term (current) drug therapy: Secondary | ICD-10-CM | POA: Diagnosis not present

## 2023-12-11 DIAGNOSIS — L4050 Arthropathic psoriasis, unspecified: Secondary | ICD-10-CM | POA: Diagnosis not present

## 2023-12-11 DIAGNOSIS — M5416 Radiculopathy, lumbar region: Secondary | ICD-10-CM | POA: Diagnosis not present

## 2023-12-15 DIAGNOSIS — G8929 Other chronic pain: Secondary | ICD-10-CM | POA: Diagnosis not present

## 2023-12-15 DIAGNOSIS — M5441 Lumbago with sciatica, right side: Secondary | ICD-10-CM | POA: Diagnosis not present

## 2023-12-28 DIAGNOSIS — L409 Psoriasis, unspecified: Secondary | ICD-10-CM | POA: Diagnosis not present

## 2023-12-28 DIAGNOSIS — I10 Essential (primary) hypertension: Secondary | ICD-10-CM | POA: Diagnosis not present

## 2023-12-28 DIAGNOSIS — K219 Gastro-esophageal reflux disease without esophagitis: Secondary | ICD-10-CM | POA: Diagnosis not present

## 2023-12-28 DIAGNOSIS — F32A Depression, unspecified: Secondary | ICD-10-CM | POA: Diagnosis not present

## 2023-12-28 DIAGNOSIS — M545 Low back pain, unspecified: Secondary | ICD-10-CM | POA: Diagnosis not present

## 2023-12-28 DIAGNOSIS — G8929 Other chronic pain: Secondary | ICD-10-CM | POA: Diagnosis not present

## 2023-12-28 DIAGNOSIS — E079 Disorder of thyroid, unspecified: Secondary | ICD-10-CM | POA: Diagnosis not present

## 2024-01-05 DIAGNOSIS — Z01818 Encounter for other preprocedural examination: Secondary | ICD-10-CM | POA: Diagnosis not present

## 2024-01-05 DIAGNOSIS — I10 Essential (primary) hypertension: Secondary | ICD-10-CM | POA: Diagnosis not present

## 2024-01-05 DIAGNOSIS — K219 Gastro-esophageal reflux disease without esophagitis: Secondary | ICD-10-CM | POA: Diagnosis not present

## 2024-01-05 DIAGNOSIS — L409 Psoriasis, unspecified: Secondary | ICD-10-CM | POA: Diagnosis not present

## 2024-01-05 DIAGNOSIS — L4050 Arthropathic psoriasis, unspecified: Secondary | ICD-10-CM | POA: Diagnosis not present

## 2024-01-05 DIAGNOSIS — E039 Hypothyroidism, unspecified: Secondary | ICD-10-CM | POA: Diagnosis not present

## 2024-01-05 DIAGNOSIS — J4521 Mild intermittent asthma with (acute) exacerbation: Secondary | ICD-10-CM | POA: Diagnosis not present

## 2024-01-05 DIAGNOSIS — M48062 Spinal stenosis, lumbar region with neurogenic claudication: Secondary | ICD-10-CM | POA: Diagnosis not present

## 2024-03-21 DIAGNOSIS — Z981 Arthrodesis status: Secondary | ICD-10-CM | POA: Diagnosis not present

## 2024-03-21 DIAGNOSIS — M419 Scoliosis, unspecified: Secondary | ICD-10-CM | POA: Diagnosis not present

## 2024-03-21 DIAGNOSIS — M2578 Osteophyte, vertebrae: Secondary | ICD-10-CM | POA: Diagnosis not present

## 2024-03-21 DIAGNOSIS — M160 Bilateral primary osteoarthritis of hip: Secondary | ICD-10-CM | POA: Diagnosis not present

## 2024-03-21 DIAGNOSIS — F32A Depression, unspecified: Secondary | ICD-10-CM | POA: Diagnosis not present

## 2024-03-21 DIAGNOSIS — M48062 Spinal stenosis, lumbar region with neurogenic claudication: Secondary | ICD-10-CM | POA: Diagnosis not present

## 2024-03-21 DIAGNOSIS — K219 Gastro-esophageal reflux disease without esophagitis: Secondary | ICD-10-CM | POA: Diagnosis not present

## 2024-03-21 DIAGNOSIS — M4316 Spondylolisthesis, lumbar region: Secondary | ICD-10-CM | POA: Diagnosis not present

## 2024-03-21 DIAGNOSIS — M4804 Spinal stenosis, thoracic region: Secondary | ICD-10-CM | POA: Diagnosis not present

## 2024-03-21 DIAGNOSIS — L409 Psoriasis, unspecified: Secondary | ICD-10-CM | POA: Diagnosis not present

## 2024-03-21 DIAGNOSIS — Z01818 Encounter for other preprocedural examination: Secondary | ICD-10-CM | POA: Diagnosis not present

## 2024-03-21 DIAGNOSIS — E079 Disorder of thyroid, unspecified: Secondary | ICD-10-CM | POA: Diagnosis not present

## 2024-04-08 DIAGNOSIS — M419 Scoliosis, unspecified: Secondary | ICD-10-CM | POA: Diagnosis not present

## 2024-04-08 DIAGNOSIS — M47814 Spondylosis without myelopathy or radiculopathy, thoracic region: Secondary | ICD-10-CM | POA: Diagnosis not present

## 2024-04-08 DIAGNOSIS — M4316 Spondylolisthesis, lumbar region: Secondary | ICD-10-CM | POA: Diagnosis not present

## 2024-04-08 DIAGNOSIS — M960 Pseudarthrosis after fusion or arthrodesis: Secondary | ICD-10-CM | POA: Diagnosis not present

## 2024-04-08 DIAGNOSIS — M4155 Other secondary scoliosis, thoracolumbar region: Secondary | ICD-10-CM | POA: Diagnosis not present

## 2024-04-08 DIAGNOSIS — M5412 Radiculopathy, cervical region: Secondary | ICD-10-CM | POA: Diagnosis not present

## 2024-04-08 DIAGNOSIS — M4727 Other spondylosis with radiculopathy, lumbosacral region: Secondary | ICD-10-CM | POA: Diagnosis not present

## 2024-04-08 DIAGNOSIS — M4312 Spondylolisthesis, cervical region: Secondary | ICD-10-CM | POA: Diagnosis not present

## 2024-04-08 DIAGNOSIS — M5416 Radiculopathy, lumbar region: Secondary | ICD-10-CM | POA: Diagnosis not present

## 2024-04-08 DIAGNOSIS — M4726 Other spondylosis with radiculopathy, lumbar region: Secondary | ICD-10-CM | POA: Diagnosis not present

## 2024-04-08 DIAGNOSIS — M48062 Spinal stenosis, lumbar region with neurogenic claudication: Secondary | ICD-10-CM | POA: Diagnosis not present

## 2024-04-12 DIAGNOSIS — M5416 Radiculopathy, lumbar region: Secondary | ICD-10-CM | POA: Diagnosis not present

## 2024-04-12 DIAGNOSIS — M47812 Spondylosis without myelopathy or radiculopathy, cervical region: Secondary | ICD-10-CM | POA: Diagnosis not present

## 2024-04-12 DIAGNOSIS — L4050 Arthropathic psoriasis, unspecified: Secondary | ICD-10-CM | POA: Diagnosis not present

## 2024-04-12 DIAGNOSIS — Z79899 Other long term (current) drug therapy: Secondary | ICD-10-CM | POA: Diagnosis not present

## 2024-05-11 DIAGNOSIS — M5117 Intervertebral disc disorders with radiculopathy, lumbosacral region: Secondary | ICD-10-CM | POA: Diagnosis not present

## 2024-05-11 DIAGNOSIS — M4804 Spinal stenosis, thoracic region: Secondary | ICD-10-CM | POA: Diagnosis not present

## 2024-05-11 DIAGNOSIS — M5116 Intervertebral disc disorders with radiculopathy, lumbar region: Secondary | ICD-10-CM | POA: Diagnosis not present

## 2024-05-11 DIAGNOSIS — M4316 Spondylolisthesis, lumbar region: Secondary | ICD-10-CM | POA: Diagnosis not present

## 2024-05-11 DIAGNOSIS — M960 Pseudarthrosis after fusion or arthrodesis: Secondary | ICD-10-CM | POA: Diagnosis not present

## 2024-05-11 DIAGNOSIS — M48061 Spinal stenosis, lumbar region without neurogenic claudication: Secondary | ICD-10-CM | POA: Diagnosis not present

## 2024-05-11 DIAGNOSIS — M5412 Radiculopathy, cervical region: Secondary | ICD-10-CM | POA: Diagnosis not present

## 2024-05-11 DIAGNOSIS — M4155 Other secondary scoliosis, thoracolumbar region: Secondary | ICD-10-CM | POA: Diagnosis not present

## 2024-05-11 DIAGNOSIS — M4312 Spondylolisthesis, cervical region: Secondary | ICD-10-CM | POA: Diagnosis not present

## 2024-05-11 DIAGNOSIS — M50122 Cervical disc disorder at C5-C6 level with radiculopathy: Secondary | ICD-10-CM | POA: Diagnosis not present

## 2024-05-11 DIAGNOSIS — M4314 Spondylolisthesis, thoracic region: Secondary | ICD-10-CM | POA: Diagnosis not present

## 2024-05-11 DIAGNOSIS — M5416 Radiculopathy, lumbar region: Secondary | ICD-10-CM | POA: Diagnosis not present

## 2024-05-11 DIAGNOSIS — M4727 Other spondylosis with radiculopathy, lumbosacral region: Secondary | ICD-10-CM | POA: Diagnosis not present

## 2024-05-11 DIAGNOSIS — M4722 Other spondylosis with radiculopathy, cervical region: Secondary | ICD-10-CM | POA: Diagnosis not present

## 2024-05-11 DIAGNOSIS — Z981 Arthrodesis status: Secondary | ICD-10-CM | POA: Diagnosis not present

## 2024-05-13 DIAGNOSIS — M4030 Flatback syndrome, site unspecified: Secondary | ICD-10-CM | POA: Diagnosis not present

## 2024-05-13 DIAGNOSIS — M4155 Other secondary scoliosis, thoracolumbar region: Secondary | ICD-10-CM | POA: Diagnosis not present

## 2024-05-13 DIAGNOSIS — M47816 Spondylosis without myelopathy or radiculopathy, lumbar region: Secondary | ICD-10-CM | POA: Diagnosis not present

## 2024-05-13 DIAGNOSIS — Z981 Arthrodesis status: Secondary | ICD-10-CM | POA: Diagnosis not present

## 2024-05-13 DIAGNOSIS — M4727 Other spondylosis with radiculopathy, lumbosacral region: Secondary | ICD-10-CM | POA: Diagnosis not present

## 2024-05-13 DIAGNOSIS — M4316 Spondylolisthesis, lumbar region: Secondary | ICD-10-CM | POA: Diagnosis not present

## 2024-05-13 DIAGNOSIS — M810 Age-related osteoporosis without current pathological fracture: Secondary | ICD-10-CM | POA: Diagnosis not present

## 2024-05-13 DIAGNOSIS — L4050 Arthropathic psoriasis, unspecified: Secondary | ICD-10-CM | POA: Diagnosis not present

## 2024-05-30 DIAGNOSIS — M25511 Pain in right shoulder: Secondary | ICD-10-CM | POA: Diagnosis not present

## 2024-05-30 DIAGNOSIS — M75121 Complete rotator cuff tear or rupture of right shoulder, not specified as traumatic: Secondary | ICD-10-CM | POA: Diagnosis not present

## 2024-05-31 ENCOUNTER — Other Ambulatory Visit: Payer: Self-pay | Admitting: Orthopedic Surgery

## 2024-05-31 DIAGNOSIS — M75121 Complete rotator cuff tear or rupture of right shoulder, not specified as traumatic: Secondary | ICD-10-CM

## 2024-06-01 ENCOUNTER — Ambulatory Visit
Admission: RE | Admit: 2024-06-01 | Discharge: 2024-06-01 | Disposition: A | Discharge status: Home / Self Care | Source: Ambulatory Visit | Attending: Orthopedic Surgery | Admitting: Orthopedic Surgery

## 2024-06-01 DIAGNOSIS — M7581 Other shoulder lesions, right shoulder: Secondary | ICD-10-CM | POA: Diagnosis not present

## 2024-06-01 DIAGNOSIS — M75121 Complete rotator cuff tear or rupture of right shoulder, not specified as traumatic: Secondary | ICD-10-CM | POA: Diagnosis not present

## 2024-06-01 DIAGNOSIS — M129 Arthropathy, unspecified: Secondary | ICD-10-CM | POA: Diagnosis not present

## 2024-06-07 DIAGNOSIS — M8000XK Age-related osteoporosis with current pathological fracture, unspecified site, subsequent encounter for fracture with nonunion: Secondary | ICD-10-CM | POA: Diagnosis not present

## 2024-07-28 ENCOUNTER — Other Ambulatory Visit: Payer: Self-pay | Admitting: Surgery

## 2024-08-08 ENCOUNTER — Other Ambulatory Visit: Payer: Self-pay

## 2024-08-08 ENCOUNTER — Inpatient Hospital Stay
Admission: RE | Admit: 2024-08-08 | Discharge: 2024-08-08 | Disposition: A | Source: Ambulatory Visit | Attending: Surgery

## 2024-08-08 HISTORY — DX: Gastro-esophageal reflux disease without esophagitis: K21.9

## 2024-08-08 HISTORY — DX: Essential (primary) hypertension: I10

## 2024-08-08 HISTORY — DX: Complete rotator cuff tear or rupture of right shoulder, not specified as traumatic: M75.121

## 2024-08-08 HISTORY — DX: Other shoulder lesions, right shoulder: M75.81

## 2024-08-08 HISTORY — DX: Other chronic pain: G89.29

## 2024-08-08 HISTORY — DX: Hypothyroidism, unspecified: E03.9

## 2024-08-08 HISTORY — DX: Spinal stenosis, lumbar region with neurogenic claudication: M48.062

## 2024-08-08 HISTORY — DX: Mild intermittent asthma with (acute) exacerbation: J45.21

## 2024-08-08 HISTORY — DX: Arthropathic psoriasis, unspecified: L40.50

## 2024-08-08 HISTORY — DX: Age-related osteoporosis without current pathological fracture: M81.0

## 2024-08-11 ENCOUNTER — Ambulatory Visit

## 2024-08-11 ENCOUNTER — Ambulatory Visit
Admission: RE | Admit: 2024-08-11 | Discharge: 2024-08-11 | Disposition: A | Source: Home / Self Care | Attending: Surgery | Admitting: Surgery

## 2024-08-11 ENCOUNTER — Encounter: Payer: Self-pay | Admitting: Surgery

## 2024-08-11 ENCOUNTER — Other Ambulatory Visit: Payer: Self-pay

## 2024-08-11 ENCOUNTER — Encounter: Payer: Self-pay | Admitting: Urgent Care

## 2024-08-11 ENCOUNTER — Encounter: Admission: RE | Disposition: A | Payer: Self-pay | Source: Home / Self Care | Attending: Surgery

## 2024-08-11 ENCOUNTER — Ambulatory Visit: Admitting: Certified Registered"

## 2024-08-11 MED ORDER — BUPIVACAINE HCL (PF) 0.5 % IJ SOLN
INTRAMUSCULAR | Status: AC
  Filled 2024-08-11: qty 10

## 2024-08-11 MED ORDER — CHLORHEXIDINE GLUCONATE 0.12 % MT SOLN
OROMUCOSAL | Status: AC
  Filled 2024-08-11: qty 15

## 2024-08-11 MED ORDER — KETOROLAC TROMETHAMINE 15 MG/ML IJ SOLN
INTRAMUSCULAR | Status: AC
  Filled 2024-08-11: qty 1

## 2024-08-11 MED ORDER — OXYCODONE HCL 5 MG PO TABS
ORAL_TABLET | ORAL | Status: AC
  Filled 2024-08-11: qty 1

## 2024-08-11 MED ORDER — BUPIVACAINE-EPINEPHRINE (PF) 0.5% -1:200000 IJ SOLN
INTRAMUSCULAR | Status: DC | PRN
  Administered 2024-08-11: 30 mL

## 2024-08-11 MED ORDER — BUPIVACAINE-EPINEPHRINE (PF) 0.5% -1:200000 IJ SOLN
INTRAMUSCULAR | Status: AC
  Filled 2024-08-11: qty 30

## 2024-08-11 MED ORDER — ROCURONIUM BROMIDE 100 MG/10ML IV SOLN
INTRAVENOUS | Status: DC | PRN
  Administered 2024-08-11: 50 mg via INTRAVENOUS
  Administered 2024-08-11 (×2): 10 mg via INTRAVENOUS

## 2024-08-11 MED ORDER — BUPIVACAINE LIPOSOME 1.3 % IJ SUSP
INTRAMUSCULAR | Status: AC
  Filled 2024-08-11: qty 20

## 2024-08-11 MED ORDER — BUPIVACAINE LIPOSOME 1.3 % IJ SUSP
INTRAMUSCULAR | Status: DC | PRN
  Administered 2024-08-11: 20 mL

## 2024-08-11 MED ORDER — LACTATED RINGERS IV SOLN: INTRAVENOUS | Status: DC

## 2024-08-11 MED ORDER — ACETAMINOPHEN 10 MG/ML IV SOLN
INTRAVENOUS | Status: DC | PRN
  Administered 2024-08-11: 1000 mg via INTRAVENOUS

## 2024-08-11 MED ORDER — OXYCODONE HCL 5 MG PO TABS
5.0000 mg | ORAL_TABLET | Freq: Once | ORAL | Status: AC | PRN
  Administered 2024-08-11: 5 mg via ORAL

## 2024-08-11 MED ORDER — FENTANYL CITRATE (PF) 100 MCG/2ML IJ SOLN
INTRAMUSCULAR | Status: AC
  Filled 2024-08-11: qty 2

## 2024-08-11 MED ORDER — DEXAMETHASONE SOD PHOSPHATE PF 10 MG/ML IJ SOLN
INTRAMUSCULAR | Status: DC | PRN
  Administered 2024-08-11: 10 mg via INTRAVENOUS

## 2024-08-11 MED ORDER — OXYCODONE HCL 5 MG/5ML PO SOLN: 5.0000 mg | Freq: Once | ORAL | Status: AC | PRN

## 2024-08-11 MED ORDER — OXYCODONE HCL 5 MG PO TABS: 5.0000 mg | ORAL_TABLET | ORAL | Status: DC | PRN

## 2024-08-11 MED ORDER — LIDOCAINE HCL (CARDIAC) PF 100 MG/5ML IV SOSY
PREFILLED_SYRINGE | INTRAVENOUS | Status: DC | PRN
  Administered 2024-08-11: 60 mg via INTRAVENOUS

## 2024-08-11 MED ORDER — MIDAZOLAM HCL (PF) 2 MG/2ML IJ SOLN
INTRAMUSCULAR | Status: DC | PRN
  Administered 2024-08-11: 2 mg via INTRAVENOUS

## 2024-08-11 MED ORDER — PROPOFOL 10 MG/ML IV BOLUS
INTRAVENOUS | Status: DC | PRN
  Administered 2024-08-11: 30 mg via INTRAVENOUS
  Administered 2024-08-11: 170 mg via INTRAVENOUS

## 2024-08-11 MED ORDER — HYDROMORPHONE HCL 1 MG/ML IJ SOLN
0.2500 mg | INTRAMUSCULAR | Status: DC | PRN
  Administered 2024-08-11 (×2): 0.5 mg via INTRAVENOUS

## 2024-08-11 MED ORDER — FENTANYL CITRATE (PF) 100 MCG/2ML IJ SOLN
INTRAMUSCULAR | Status: DC | PRN
  Administered 2024-08-11: 50 ug via INTRAVENOUS

## 2024-08-11 MED ORDER — CEFAZOLIN SODIUM-DEXTROSE 2-4 GM/100ML-% IV SOLN
INTRAVENOUS | Status: AC
  Filled 2024-08-11: qty 100

## 2024-08-11 MED ORDER — LACTATED RINGERS IR SOLN
Status: DC | PRN
  Administered 2024-08-11 (×2): 3000 mL

## 2024-08-11 MED ORDER — PHENYLEPHRINE HCL-NACL 20-0.9 MG/250ML-% IV SOLN
INTRAVENOUS | Status: DC | PRN
  Administered 2024-08-11: 30 ug/min via INTRAVENOUS

## 2024-08-11 MED ORDER — ONDANSETRON HCL 4 MG/2ML IJ SOLN: 4.0000 mg | Freq: Four times a day (QID) | INTRAMUSCULAR | Status: DC | PRN

## 2024-08-11 MED ORDER — METOCLOPRAMIDE HCL 10 MG PO TABS: 5.0000 mg | ORAL_TABLET | Freq: Three times a day (TID) | ORAL | Status: DC | PRN

## 2024-08-11 MED ORDER — EPINEPHRINE PF 1 MG/ML IJ SOLN
INTRAMUSCULAR | Status: AC
  Filled 2024-08-11: qty 2

## 2024-08-11 MED ORDER — SODIUM CHLORIDE 0.9 % IV SOLN: INTRAVENOUS | Status: DC

## 2024-08-11 MED ORDER — FENTANYL CITRATE (PF) 50 MCG/ML IJ SOSY
PREFILLED_SYRINGE | INTRAMUSCULAR | Status: AC
  Filled 2024-08-11: qty 1

## 2024-08-11 MED ORDER — MIDAZOLAM HCL 2 MG/2ML IJ SOLN
INTRAMUSCULAR | Status: AC
  Filled 2024-08-11: qty 2

## 2024-08-11 MED ORDER — KETOROLAC TROMETHAMINE 15 MG/ML IJ SOLN
15.0000 mg | Freq: Once | INTRAMUSCULAR | Status: AC
  Administered 2024-08-11: 15 mg via INTRAVENOUS

## 2024-08-11 MED ORDER — ACETAMINOPHEN 325 MG PO TABS: 325.0000 mg | ORAL_TABLET | Freq: Four times a day (QID) | ORAL | Status: DC | PRN

## 2024-08-11 MED ORDER — OXYCODONE HCL 5 MG PO TABS
5.0000 mg | ORAL_TABLET | ORAL | 0 refills | Status: AC | PRN
  Filled 2024-08-11: qty 40, 4d supply, fill #0

## 2024-08-11 MED ORDER — ONDANSETRON HCL 4 MG PO TABS: 4.0000 mg | ORAL_TABLET | Freq: Four times a day (QID) | ORAL | Status: DC | PRN

## 2024-08-11 MED ORDER — HYDROMORPHONE HCL 1 MG/ML IJ SOLN
INTRAMUSCULAR | Status: AC
  Filled 2024-08-11: qty 1

## 2024-08-11 MED ORDER — ONDANSETRON HCL 4 MG/2ML IJ SOLN
INTRAMUSCULAR | Status: DC | PRN
  Administered 2024-08-11: 4 mg via INTRAVENOUS

## 2024-08-11 MED ORDER — CEFAZOLIN SODIUM-DEXTROSE 2-4 GM/100ML-% IV SOLN
2.0000 g | INTRAVENOUS | Status: AC
  Administered 2024-08-11: 2 g via INTRAVENOUS

## 2024-08-11 MED ORDER — DEXMEDETOMIDINE HCL IN NACL 200 MCG/50ML IV SOLN
INTRAVENOUS | Status: DC | PRN
  Administered 2024-08-11: 8 ug via INTRAVENOUS

## 2024-08-11 MED ORDER — METOCLOPRAMIDE HCL 5 MG/ML IJ SOLN: 5.0000 mg | Freq: Three times a day (TID) | INTRAMUSCULAR | Status: DC | PRN

## 2024-08-11 NOTE — H&P (Signed)
 History of Present Illness:  Joanna Harrison is a 64 y.o. female who presents today as a result of a referral by Dr. Ozell Flake for right shoulder pain.   The patient's symptoms began several years ago and developed without any specific cause or injury. However, the patient does recall falling about 6 years ago when she tripped on a curb and fell onto her outstretched right arm. She did notice shoulder pain at that time and was treated with physical therapy with subsequent partial improvement in her symptoms. The patient saw Dr. Flake for these symptoms in November, 2025, as her symptoms had been worsening. Concern was raised for a rotator cuff tear, so she was sent for an MRI scan of the shoulder, then referred to me for further evaluation and treatment. The patient describes the symptoms as moderate (patient is active but has had to make modifications or give up activities) and have the quality of being aching, miserable, nagging, stabbing, tender, and throbbing. The pain is localized to the lateral arm/shoulder and localized to the anterior shoulder. These symptoms are aggravated with normal daily activities, with sleeping, carrying heavy objects, at higher levels of activity, with overhead activity, reaching behind the back, getting dressed, exercising, and activity in general. She has tried non-steroidal anti-inflammatories (Voltaren) and narcotics with limited benefit. She has tried physical therapy , rest , and a home exercise program with limited benefit. She has not tried any steroid injections for the symptoms. She does have some chronic neck and back issues, but does not feel that the shoulder symptoms are related to her neck and back issues. She denies any numbness or paresthesias down her arm to her hand. She is right-hand dominant. This complaint is not work related. She is a sports non-participant.  Shoulder Surgical History:  The patient has had no shoulder surgery in the past.  PMH/PSH/Family  History/Social History/Meds/Allergies:  I have reviewed past medical, surgical, social and family history, medications and allergies as documented in the EMR.  Current Outpatient Medications:  atorvastatin (LIPITOR) 40 MG tablet Take 1 tablet by mouth once daily 90 tablet 0  azelastine (ASTELIN) 137 mcg nasal spray PLPACE 1 SPRAY INTO BOTH NOSTRILS TWICE DAILY 90 mL 1  diclofenac (VOLTAREN) 75 MG EC tablet TAKE 1 TABLET BY MOUTH TWICE DAILY WITH MEALS 360 tablet 0  escitalopram oxalate (LEXAPRO) 20 MG tablet Take 1 tablet by mouth once daily 120 tablet 0  folic acid (FOLVITE) 1 MG tablet Take 1 tablet by mouth once daily 90 tablet 3  gabapentin (NEURONTIN) 100 MG capsule Start 1 tab nightly. Can increase to 2 or 3 tabs if needed. Can also add a dose in the day if needed. 60 capsule 2  HYDROcodone-acetaminophen  (NORCO) 5-325 mg tablet Take 1 tablet by mouth every 8 (eight) hours as needed for Pain 50 tablet 0  ixekizumab (TALTZ AUTOINJECTOR) 80 mg/mL AtIn Inject 80 mg subcutaneously every 28 (twenty-eight) days 1 mL 11  levothyroxine (SYNTHROID) 175 MCG tablet TAKE 1 TABLET BY MOUTH ONCE DAILY ON AN EMPTY STOMACH WITH A GLASS OF WATER AT LEAST 30-60 MINUTES BEFORE BREAKFAST 30 tablet 0  MELATONIN ORAL Take 1 tablet by mouth at bedtime as needed  methotrexate (RHEUMATREX) 2.5 MG tablet TAKE 8 TABLETS BY MOUTH ONCE A WEEK 96 tablet 0  montelukast (SINGULAIR) 10 mg tablet TAKE ONE TABLET BY MOUTH AT BEDTIME 90 tablet 1  multivitamin tablet Take 1 tablet by mouth once daily.  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 Arthralgias. b. Sulfasalazine  Seasonal allergic rhinitis  Thyroid  disease   Past Surgical History:  BACK SURGERY 07/07/1980  spinal fusion with Harrington rod  CESAREAN SECTION 07/07/1984  CESAREAN SECTION 07/08/1987  COLONOSCOPY 10/25/2013  Arthroscopy, partial medial lateral meniscectomy Right 12/13/2013  COLON @ [facility] 11/04/2023 (Diverticulosis/ otherwsie normal examined colon/ repeat 10 yrs/ TKT)  SPINE SURGERY 1983  Spinal fusion Herrington rods  TONSILLECTOMY   Family History:  Coronary Artery Disease (Blocked arteries around heart) Mother Arland  Arthritis Mother Arland  Myocardial Infarction (Heart attack) Mother Arland  Thyroid  disease Mother Arland  Coronary Artery Disease (Blocked arteries around heart) Father Elsie  Kidney disease Father Elsie  Psoriasis Father Elsie  Arthritis Father Elsie  Kidney failure Father William  High blood pressure (Hypertension) Father Elsie  Thyroid  disease Father Elsie  Osteoporosis (Thinning of bones) Maternal Grandmother Vinona  Colon cancer Maternal Grandmother Vinona  Cancer Maternal Grandmother Vinona  Colon cancer Maternal Grandfather Lawrence  Cancer Maternal Grandfather Lawrence  Osteoporosis (Thinning of bones) Paternal Grandmother Mary  Breast cancer Maternal Aunt Alisa  Cancer Maternal Wylie Alisa  Breast cancer Maternal Cousin  Diabetes Paternal Grandfather Aurthur  Alcohol abuse Brother Optometrist  Cancer Paternal Wylie Shuck  Alcohol abuse Paternal Uncle Cartel Mauss  Alcohol abuse Paternal Uncle Ron  Alcohol abuse Paternal Uncle Aurthur  Asthma Sister Ricka  Thyroid  disease Sister Jon  Coronary Artery Disease (Blocked arteries around heart) Brother Elsie  Died of a heart attack  Anesthesia problems Neg Hx   Social History:   Socioeconomic History:  Marital status: Married  Occupational History  Occupation: Retired  Tobacco  Use  Smoking status: Former  Current packs/day: 0.00  Types: Cigarettes  Quit date: 07/08/1987  Years since quitting: 37.0  Smokeless tobacco: Never  Tobacco comments:  Quit at age 24.  Vaping Use  Vaping status: Never Used  Substance and Sexual Activity  Alcohol use: Not Currently  Comment: rare  Drug use: No  Sexual activity: Yes  Partners: Male  Birth control/protection: None, Post-menopausal  Social History Narrative  Exercise: Water aerobics 3 times a week, 50 minutes.  Diet: Red meat 1 time a week. Fast foods and fried foods rarely.  GR with DM   Social Drivers of Health:   Physicist, Medical Strain: Low Risk (07/11/2024)  Overall Financial Resource Strain (CARDIA)  Difficulty of Paying Living Expenses: Not hard at all  Food Insecurity: No Food Insecurity (07/11/2024)  Hunger Vital Sign  Worried About Running Out of Food in the Last Year: Never true  Ran Out of Food in the Last Year: Never true  Transportation Needs: No Transportation Needs (07/11/2024)  PRAPARE - Risk Analyst (Medical): No  Lack of Transportation (Non-Medical): No   Review of Systems:  A comprehensive 14 point ROS was performed, reviewed, and the pertinent orthopaedic findings are documented in the HPI.  Physical Exam:  There were no vitals filed for this visit. General/Constitutional: The patient appears to be well-nourished, well-developed, and in no acute distress. Neuro/Psych: Normal mood and  affect, oriented to person, place and time. Eyes: Non-icteric. Pupils are equal, round, and reactive to light, and exhibit synchronous movement. ENT: Unremarkable. Lymphatic: No palpable adenopathy. Respiratory: Lungs clear to auscultation, Normal chest excursion, No wheezes, and Non-labored breathing Cardiovascular: Regular rate and rhythm. No murmurs. and No edema, swelling or tenderness, except as noted in detailed exam. Integumentary: No impressive skin lesions present, except as  noted in detailed exam. Musculoskeletal: Unremarkable, except as noted in detailed exam.  Right shoulder exam: SKIN: Area of resolving ecchymosis over anterolateral aspect of upper arm, otherwise unremarkable SWELLING: none WARMTH: none LYMPH NODES: no adenopathy palpable CREPITUS: none TENDERNESS: Mildly tender over anterolateral shoulder ROM (active):  Forward flexion: 140 degrees Abduction: 135 degrees Internal rotation: L1 ROM (passive):  Forward flexion: 150 degrees Abduction: 145 degrees ER/IR at 90 abd: 90 degrees / 60 degrees  She experiences mild to moderate pain with forward flexion and abduction, and mild pain with all other motions.  STRENGTH: Forward flexion: 4/5 Abduction: 4/5 External rotation: 4/5 Internal rotation: 4-4+/5 Pain with RC testing: Mild pain with resisted strength testing in all planes  STABILITY: Normal  SPECIAL TESTS: Vonzell' test: positive, mild Speed's test: positive Capsulitis - pain w/ passive ER: no Crossed arm test: Minimally positive Crank: Not evaluated Anterior apprehension: Negative Posterior apprehension: Not evaluated  She is neurovascularly intact to the right upper extremity.  Imaging:  Right Shoulder Imaging, MRI: MRI Shoulder Cartilage: No cartilage abnormality. MRI Shoulder Rotator Cuff: Full thickness tear of the supraspinatus. Retracted to the humeral head. MRI Shoulder Labrum / Biceps: Biceps tendinopathy. MRI Shoulder Bone: Normal bone.  Both the films and report were reviewed by myself and discussed with the patient.  Assessment:  1. Nontraumatic complete tear of right rotator cuff.  2. Rotator cuff tendinitis, right.   Plan:  The treatment options were discussed with the patient and her husband. In addition, patient educational materials were provided regarding the diagnosis and treatment options. The patient is quite frustrated by her symptoms and functional limitations, and is ready to consider more  aggressive treatment options. Therefore, I have recommended a surgical procedure, specifically a right shoulder arthroscopy with debridement, decompression, rotator cuff repair, and possible biceps tenodesis. The procedure was discussed with the patient, as were the potential risks (including bleeding, infection, nerve and/or blood vessel injury, persistent or recurrent pain, failure of the repair, progression of arthritis, need for further surgery, blood clots, strokes, heart attacks and/or arhythmias, pneumonia, etc.) and benefits. The patient states her understanding and wishes to proceed. All of the patient's questions and concerns were answered. She can call any time with further concerns. She will follow up post-surgery, routine.    H&P reviewed and patient re-examined. No changes.

## 2024-08-11 NOTE — Anesthesia Preprocedure Evaluation (Signed)
"                                    Anesthesia Evaluation  Patient identified by MRN, date of birth, ID band Patient awake    Reviewed: Allergy & Precautions, H&P , NPO status , Patient's Chart, lab work & pertinent test results  Airway Mallampati: II  TM Distance: >3 FB Neck ROM: Full    Dental no notable dental hx.    Pulmonary asthma    Pulmonary exam normal breath sounds clear to auscultation       Cardiovascular hypertension, Normal cardiovascular exam Rhythm:Regular Rate:Normal     Neuro/Psych negative neurological ROS  negative psych ROS   GI/Hepatic Neg liver ROS,GERD  ,,  Endo/Other  Hypothyroidism    Renal/GU negative Renal ROS  negative genitourinary   Musculoskeletal negative musculoskeletal ROS (+)    Abdominal   Peds negative pediatric ROS (+)  Hematology negative hematology ROS (+)   Anesthesia Other Findings   Reproductive/Obstetrics negative OB ROS                              Anesthesia Physical Anesthesia Plan  ASA: 3  Anesthesia Plan: General and Regional   Post-op Pain Management: Regional block* and Minimal or no pain anticipated   Induction: Intravenous  PONV Risk Score and Plan: 1  Airway Management Planned:   Additional Equipment:   Intra-op Plan:   Post-operative Plan: Extubation in OR  Informed Consent: I have reviewed the patients History and Physical, chart, labs and discussed the procedure including the risks, benefits and alternatives for the proposed anesthesia with the patient or authorized representative who has indicated his/her understanding and acceptance.     Dental advisory given  Plan Discussed with: CRNA  Anesthesia Plan Comments:         Anesthesia Quick Evaluation  "

## 2024-08-11 NOTE — Anesthesia Postprocedure Evaluation (Signed)
"   Anesthesia Post Note  Patient: Joanna Harrison  Procedure(s) Performed: ARTHROSCOPY, SHOULDER WITH DEBRIDEMENT (Right: Shoulder) DECOMPRESSION, SUBACROMIAL SPACE (Right: Shoulder) REPAIR, ROTATOR CUFF, OPEN (Right: Shoulder) REPAIR, SHOULDER, SUBSCAPULAR, ARTHROSCOPIC (Right: Shoulder)  Patient location during evaluation: PACU Anesthesia Type: Regional and General Level of consciousness: awake and alert Pain management: pain level controlled Vital Signs Assessment: post-procedure vital signs reviewed and stable Respiratory status: spontaneous breathing, nonlabored ventilation, respiratory function stable and patient connected to nasal cannula oxygen Cardiovascular status: blood pressure returned to baseline and stable Postop Assessment: no apparent nausea or vomiting Anesthetic complications: no   No notable events documented.   Last Vitals:  Vitals:   08/11/24 1545 08/11/24 1600  BP: 108/64 103/66  Pulse: 65 62  Resp: 18 (!) 21  Temp:    SpO2: 94% 94%    Last Pain:  Vitals:   08/11/24 1616  TempSrc:   PainSc: 5                  Redell MARLA Breaker      "

## 2024-08-11 NOTE — Op Note (Addendum)
 08/11/2024  3:35 PM  Patient:   Joanna Harrison  Pre-Op Diagnosis:   Impingement/tendinopathy with full-thickness rotator cuff tear, right shoulder.  Post-Op Diagnosis:   Impingement/tendinopathy with full-thickness rotator cuff tear, degenerative labral fraying, and biceps tendinopathy, right shoulder.  Procedure:   Limited arthroscopic debridement, arthroscopic repair of partial-thickness subscapularis tendon tear, arthroscopic subacromial decompression, and mini-open repair of supraspinatus tendon tear, right shoulder.  Anesthesia:   General endotracheal with interscalene block using Exparel  placed preoperatively by the anesthesiologist.  Surgeon:   DOROTHA Reyes Maltos, MD  Assistant:   Shelba Rakers, RNFA  Findings:   As above. There was a substantial articular sided partial-thickness tear involving the upper insertional fibers of the subscapularis tendon involving approximately 70% of the footprint. There also was a full-thickness tear involving the middle and posterior portions of the supraspinatus tendon. The remainder of the rotator cuff was in satisfactory condition. The long head of the biceps tendon had torn nearly completely with a substantial portion of it located within the joint. There was moderate fraying of the labrum anteriorly and superiorly without frank detachment from the glenoid rim. The articular surfaces of the glenoid and humerus both were in satisfactory condition.  Complications:   None  Estimated blood loss:   20 cc  Fluids:   1000 cc  Tourniquet time:   None  Drains:   None  Closure:   Staples      Brief clinical note:   The patient is a 64 year old female with a history of progressively worsening right shoulder pain and weakness. Her symptoms have progressed despite medications, activity modification, etc. The patient's history and examination are consistent with impingement/tendinopathy with a rotator cuff tear. These findings were confirmed by MRI scan. The  patient presents at this time for definitive management of these shoulder symptoms.  Procedure:   The patient underwent placement of an interscalene block using Exparel  by the anesthesiologist in the preoperative holding area before being brought into the operating room and lain in the supine position. The patient then underwent general endotracheal intubation and anesthesia before being repositioned in the beach chair position using the beach chair positioner. The right shoulder and upper extremity were prepped with DuraPrep solution before being draped sterilely. Preoperative antibiotics were administered. A timeout was performed to confirm the proper surgical site before the expected portal sites and incision site were injected with 0.5% Sensorcaine  with epinephrine .   A posterior portal was created and the glenohumeral joint thoroughly inspected with the findings as described above. An anterior portal was created using an outside-in technique. The labrum and rotator cuff were further probed, again confirming the above-noted findings. The areas of labral fraying were debrided back to stable margins, as were the torn margins of the rotator cuff tears and areas of synovitis anteriorly and superiorly. The intra-articular portion of the long head of the biceps tendon was released from its labral anchor using the full ArthroCare wand and retrieved through the anterior portal site using a pituitary rongeur. The ArthroCare wand was re-inserted and used to obtain hemostasis as well as to anneal the labrum superiorly and anteriorly. The instruments were removed from the joint after suctioning the excess fluid.  The camera was repositioned through the posterior portal into the subacromial space. A separate lateral portal was created using an outside-in technique. The 3.5 mm full-radius resector was introduced and used to perform a subtotal bursectomy. The ArthroCare wand was then inserted and used to remove the  periosteal tissue off the  undersurface of the anterior third of the acromion as well as to recess the coracoacromial ligament from its attachment along the anterior and lateral margins of the acromion. The 4.0 mm acromionizing bur was introduced and used to complete the decompression by removing the undersurface of the anterior third of the acromion. The full radius resector was reintroduced to remove any residual bony debris before the ArthroCare wand was reintroduced to obtain hemostasis. The instruments were then removed from the subacromial space after suctioning the excess fluid.  An approximately 4-5 cm incision was made over the anterolateral aspect of the shoulder beginning at the anterolateral corner of the acromion and extending distally in line with the bicipital groove. This incision was carried down through the subcutaneous tissues to expose the deltoid fascia. The raphae between the anterior and middle thirds was identified and this plane developed to provide access into the subacromial space. Additional bursal tissues were debrided sharply using Metzenbaum scissors. The rotator cuff tear involving the supraspinatus tendon was readily identified.   The margins of the rotator cuff tear were debrided sharply with a #15 blade and the exposed greater tuberosity roughened with a rongeur. The tear was repaired using two Smith [employer] Nephew 2.8 mm Q-Fix anchors. These sutures were then brought back laterally and secured using two Smith & Nephew Healicoil knotless RegeneSorb anchors to create a two-layer closure. An apparent watertight closure was obtained.  The wound was copiously irrigated with sterile saline solution before the deltoid raphae was reapproximated using 2-0 Vicryl interrupted sutures. The subcutaneous tissues were closed in two layers using 2-0 Vicryl interrupted sutures before the skin was closed using staples. The portal sites also were closed using staples. A sterile bulky dressing was  applied to the shoulder before the arm was placed into a shoulder immobilizer. The patient was then awakened, extubated, and returned to the recovery room in satisfactory condition after tolerating the procedure well.

## 2024-08-11 NOTE — Transfer of Care (Signed)
 Immediate Anesthesia Transfer of Care Note  Patient: Joanna Harrison  Procedure(s) Performed: ARTHROSCOPY, SHOULDER WITH DEBRIDEMENT (Right: Shoulder) DECOMPRESSION, SUBACROMIAL SPACE (Right: Shoulder) REPAIR, ROTATOR CUFF, OPEN (Right: Shoulder) REPAIR, SHOULDER, SUBSCAPULAR, ARTHROSCOPIC (Right: Shoulder)  Patient Location: PACU  Anesthesia Type:General  Level of Consciousness: awake, drowsy, and patient cooperative  Airway & Oxygen Therapy: Patient Spontanous Breathing and Patient connected to nasal cannula oxygen  Post-op Assessment: Report given to RN and Post -op Vital signs reviewed and stable  Post vital signs: Reviewed and stable  Last Vitals:  Vitals Value Taken Time  BP 108/57 08/11/24 15:21  Temp 36.1 C 08/11/24 15:21  Pulse 67 08/11/24 15:24  Resp 21 08/11/24 15:24  SpO2 92 % 08/11/24 15:24  Vitals shown include unfiled device data.  Last Pain:  Vitals:   08/11/24 1521  TempSrc:   PainSc: Asleep         Complications: No notable events documented.

## 2024-08-11 NOTE — Anesthesia Procedure Notes (Addendum)
 Procedure Name: Intubation Date/Time: 08/11/2024 1:26 PM  Performed by: Lennie Lamarr HERO, CRNAPre-anesthesia Checklist: Patient identified, Emergency Drugs available, Suction available and Patient being monitored Patient Re-evaluated:Patient Re-evaluated prior to induction Oxygen Delivery Method: Circle system utilized Preoxygenation: Pre-oxygenation with 100% oxygen Induction Type: IV induction Ventilation: Mask ventilation without difficulty Laryngoscope Size: McGrath and 3 Grade View: Grade I Tube type: Oral Tube size: 7.0 mm Number of attempts: 1 Airway Equipment and Method: Stylet and Oral airway Placement Confirmation: ETT inserted through vocal cords under direct vision, positive ETCO2 and breath sounds checked- equal and bilateral Secured at: 21 cm Tube secured with: Tape Dental Injury: Teeth and Oropharynx as per pre-operative assessment

## 2024-08-11 NOTE — Discharge Instructions (Addendum)
 Orthopedic discharge instructions: Keep dressing dry and intact.  May shower after dressing changed on post-op day #4 (Monday).  Cover staples with Band-Aids after drying off. Apply ice frequently to shoulder. May resume Voltaren 75 mg BID OR take ibuprofen 600-800 mg TID with meals for 7-10 days, then as necessary. Take oxycodone  as prescribed when needed.  May supplement with ES Tylenol  if necessary. Keep shoulder immobilizer on at all times except may remove for bathing purposes. Follow-up in 10-14 days or as scheduled.     Interscalene Nerve Block with Exparel    For your surgery you have received an Interscalene Nerve Block with Exparel . Nerve Blocks affect many types of nerves, including nerves that control movement, pain and normal sensation.  You may experience feelings such as numbness, tingling, heaviness, weakness or the inability to move your arm or the feeling or sensation that your arm has fallen asleep. A nerve block with Exparel  can last up to 5 days.  Usually the weakness wears off first.  The tingling and heaviness usually wear off next.  Finally you may start to notice pain.  Keep in mind that this may occur in any order.  Once a nerve block starts to wear off it is usually completely gone within 60 minutes. ISNB may cause mild shortness of breath, a hoarse voice, blurry vision, unequal pupils, or drooping of the face on the same side as the nerve block.  These symptoms will usually resolve with the numbness.  Very rarely the procedure itself can cause mild seizures. If needed, your surgeon will give you a prescription for pain medication.  It will take about 60 minutes for the oral pain medication to become fully effective.  So, it is recommended that you start taking this medication before the nerve block first begins to wear off, or when you first begin to feel discomfort. Take your pain medication only as prescribed.  Pain medication can cause sedation and decrease your  breathing if you take more than you need for the level of pain that you have. Nausea is a common side effect of many pain medications.  You may want to eat something before taking your pain medicine to prevent nausea. After an Interscalene nerve block, you cannot feel pain, pressure or extremes in temperature in the effected arm.  Because your arm is numb it is at an increased risk for injury.  To decrease the possibility of injury, please practice the following:  While you are awake change the position of your arm frequently to prevent too much pressure on any one area for prolonged periods of time.  If you have a cast or tight dressing, check the color or your fingers every couple of hours.  Call your surgeon with the appearance of any discoloration (white or blue). If you are given a sling to wear before you go home, please wear it  at all times until the block has completely worn off.  Do not get up at night without your sling. Please contact [facility] or your surgeon if you do not begin to regain sensation after 7 days from the surgery.  Anesthesia may be contacted by calling the Same Day Surgery Department, Mon. through Fri., 6 am to 4 pm at [phone].   If you experience any other problems or concerns, please contact your surgeon's office. If you experience severe or prolonged shortness of breath go to the nearest emergency department.  SHOULDER SLING IMMOBILIZER   VIDEO Slingshot 2 Shoulder Brace Application -  YouTube ---[email or web address]  INSTRUCTIONS While supporting the injured arm, slide the forearm into the sling. Wrap the adjustable shoulder strap around the neck and shoulders and attach the strap end to the sling using  the alligator strap tab.  Adjust the shoulder strap to the required length. Position the shoulder pad behind the neck. To secure the shoulder pad location (optional), pull the shoulder strap away from the shoulder pad, unfold the  hook material on the top of the pad, then press the shoulder strap back onto the hook material to secure the pad in place. Attach the closure strap across the open top of the sling. Position the strap so that it holds the arm securely in the sling. Next, attach the thumb strap to the open end of the sling between the thumb and fingers. After sling has been fit, it may be easily removed and reapplied using the quick release buckle on shoulder strap. If a neutral pillow or 15 abduction pillow is included, place the pillow at the waistline. Attach the sling to the pillow, lining up hook material on the pillow with the loop on sling. Adjust the waist strap to fit.  If waist strap is too long, cut it to fit. Use the small piece of double sided hook material (located on top of the pillow) to secure the strap end. Place the double sided hook material on the inside of the cut strap end and secure it to the waist strap.     If no pillow is included, attach the waist strap to the sling and adjust to fit.    Washing Instructions: Straps and sling must be removed and cleaned regularly depending on your activity level and perspiration. Hand wash straps and sling in cold water with mild detergent, rinse, air dry

## 2024-08-11 NOTE — Anesthesia Procedure Notes (Signed)
 Anesthesia Regional Block: Interscalene brachial plexus block   Pre-Anesthetic Checklist: , timeout performed,  Correct Patient, Correct Site, Correct Laterality,  Correct Procedure, Correct Position, site marked,  Risks and benefits discussed,  Surgical consent,  Pre-op evaluation,  At surgeon's request and post-op pain management  Laterality: Right and Upper  Prep: alcohol swabs       Needles:  Injection technique: Single-shot  Needle Type: Stimiplex     Needle Length: 10cm  Needle Gauge: 21     Additional Needles:   Procedures:, nerve stimulator,,, ultrasound used (permanent image in chart),,     Nerve Stimulator or Paresthesia:  Response: biceps flexion  Additional Responses:   Narrative:  Start time: 08/11/2024 12:17 PM End time: 08/11/2024 12:19 PM Injection made incrementally with aspirations every 5 mL.  Performed by: Personally   Additional Notes: Functioning IV was confirmed and monitors were applied.  A 50mm 22ga Stimuplex needle was used. Sterile prep and drape,hand hygiene and sterile gloves were used.  Negative aspiration and negative test dose prior to incremental administration of local anesthetic. The patient tolerated the procedure well.

## 2024-08-12 ENCOUNTER — Encounter: Payer: Self-pay | Admitting: Surgery
